# Patient Record
Sex: Male | Born: 1985 | Race: White | Hispanic: No | Marital: Single | Smoking: Former smoker
Health system: Southern US, Community
[De-identification: ages and names within clinical notes are randomized; demographics above are authoritative.]

## PROBLEM LIST (undated history)

## (undated) DIAGNOSIS — R7611 Nonspecific reaction to tuberculin skin test without active tuberculosis: Secondary | ICD-10-CM

## (undated) HISTORY — DX: Nonspecific reaction to tuberculin skin test without active tuberculosis: R76.11

---

## 2005-06-12 ENCOUNTER — Ambulatory Visit (HOSPITAL_COMMUNITY): Admission: RE | Admit: 2005-06-12 | Discharge: 2005-06-12 | Payer: Self-pay | Admitting: Pediatrics

## 2007-10-29 ENCOUNTER — Encounter: Admission: RE | Admit: 2007-10-29 | Discharge: 2008-01-27 | Payer: Self-pay | Admitting: Orthopedic Surgery

## 2011-09-26 ENCOUNTER — Encounter: Payer: Self-pay | Admitting: Internal Medicine

## 2011-09-26 ENCOUNTER — Ambulatory Visit (INDEPENDENT_AMBULATORY_CARE_PROVIDER_SITE_OTHER): Payer: BC Managed Care – PPO | Admitting: Internal Medicine

## 2011-09-26 VITALS — BP 128/80 | HR 70 | Temp 98.2°F | Resp 18 | Ht 72.5 in | Wt 188.0 lb

## 2011-09-26 DIAGNOSIS — L309 Dermatitis, unspecified: Secondary | ICD-10-CM

## 2011-09-26 DIAGNOSIS — F909 Attention-deficit hyperactivity disorder, unspecified type: Secondary | ICD-10-CM | POA: Insufficient documentation

## 2011-09-26 DIAGNOSIS — R7611 Nonspecific reaction to tuberculin skin test without active tuberculosis: Secondary | ICD-10-CM

## 2011-09-26 DIAGNOSIS — L259 Unspecified contact dermatitis, unspecified cause: Secondary | ICD-10-CM

## 2011-09-26 MED ORDER — AMPHETAMINE-DEXTROAMPHETAMINE 10 MG PO TABS: 10.0000 mg | ORAL_TABLET | Freq: Two times a day (BID) | ORAL | Status: DC

## 2011-09-26 MED ORDER — PREDNISONE 10 MG PO TABS: ORAL_TABLET | ORAL | Status: DC

## 2011-09-26 MED ORDER — METHYLPREDNISOLONE ACETATE 80 MG/ML IJ SUSP
80.0000 mg | Freq: Once | INTRAMUSCULAR | Status: AC
  Administered 2011-09-26: 80 mg via INTRAMUSCULAR
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
## 2011-09-26 NOTE — Patient Instructions (Addendum)
Pepcid 20 mg twice daily Alavert/Allegra  one daily  Prednisone Dosepak as directed  Please call in one or 2 weeks for dermatology referral if unimproved  Smoking tobacco is very bad for your health. You should stop smoking immediately.

## 2011-11-28 ENCOUNTER — Encounter (HOSPITAL_COMMUNITY): Payer: Self-pay | Admitting: *Deleted

## 2011-11-28 ENCOUNTER — Other Ambulatory Visit: Payer: Self-pay

## 2011-11-28 ENCOUNTER — Telehealth: Payer: Self-pay

## 2011-11-28 ENCOUNTER — Emergency Department (HOSPITAL_COMMUNITY)
Admission: EM | Admit: 2011-11-28 | Discharge: 2011-11-29 | Disposition: A | Discharge status: Home / Self Care | Payer: BC Managed Care – PPO | Attending: Emergency Medicine | Admitting: Emergency Medicine

## 2011-11-28 DIAGNOSIS — R10816 Epigastric abdominal tenderness: Secondary | ICD-10-CM | POA: Insufficient documentation

## 2011-11-28 DIAGNOSIS — R55 Syncope and collapse: Secondary | ICD-10-CM | POA: Insufficient documentation

## 2011-11-28 DIAGNOSIS — R079 Chest pain, unspecified: Secondary | ICD-10-CM | POA: Insufficient documentation

## 2011-11-28 DIAGNOSIS — R42 Dizziness and giddiness: Secondary | ICD-10-CM | POA: Insufficient documentation

## 2011-11-28 DIAGNOSIS — R198 Other specified symptoms and signs involving the digestive system and abdomen: Secondary | ICD-10-CM

## 2011-11-28 LAB — DIFFERENTIAL
Basophils Absolute: 0 10*3/uL (ref 0.0–0.1)
Eosinophils Absolute: 0.1 10*3/uL (ref 0.0–0.7)
Lymphocytes Relative: 27 % (ref 12–46)
Lymphs Abs: 2 10*3/uL (ref 0.7–4.0)
Monocytes Relative: 7 % (ref 3–12)
Neutro Abs: 4.7 10*3/uL (ref 1.7–7.7)
Neutrophils Relative %: 64 % (ref 43–77)

## 2011-11-28 LAB — COMPREHENSIVE METABOLIC PANEL
ALT: 7 U/L (ref 0–53)
Albumin: 4.6 g/dL (ref 3.5–5.2)
Alkaline Phosphatase: 67 U/L (ref 39–117)
BUN: 14 mg/dL (ref 6–23)
Calcium: 10.6 mg/dL — ABNORMAL HIGH (ref 8.4–10.5)
Creatinine, Ser: 0.96 mg/dL (ref 0.50–1.35)
GFR calc non Af Amer: >90 mL/min (ref >90)
Glucose, Bld: 116 mg/dL — ABNORMAL HIGH (ref 70–99)
Potassium: 4.4 mEq/L (ref 3.5–5.1)
Sodium: 137 mEq/L (ref 135–145)
Total Bilirubin: 0.4 mg/dL (ref 0.3–1.2)
Total Protein: 7.7 g/dL (ref 6.0–8.3)

## 2011-11-28 LAB — CBC
RBC: 5.14 MIL/uL (ref 4.22–5.81)
RDW: 12.4 % (ref 11.5–15.5)
WBC: 7.3 10*3/uL (ref 4.0–10.5)

## 2011-11-28 LAB — POCT I-STAT TROPONIN I

## 2011-11-28 MED ORDER — GI COCKTAIL ~~LOC~~
30.0000 mL | Freq: Once | ORAL | Status: AC
  Administered 2011-11-28: 30 mL via ORAL
  Filled 2011-11-28: qty 30

## 2011-11-28 NOTE — Telephone Encounter (Signed)
Spoke with pt and he states for the past week he has been having tightness in his chest around his heart.  Per pt it lasts anywhere from 30 minutes to 1.5 hours and tends to go away and come back.  When pt has these episodes he experiences dizziness as well. Offered pt appt today but he states he has finals until 6:30. Pt has an appt for 11/29/11.

## 2011-11-28 NOTE — ED Provider Notes (Addendum)
History     CSN: [id number] Arrival date & time: 11/28/2011  5:33 PM   First MD Initiated Contact with Patient 11/28/11 2210      Chief Complaint  Patient presents with  . Chest Pain    pt states "my heart feels like it is struggling to breathe." x's 5-6 days. pt states pain has been getting worse.     (Consider location/radiation/quality/duration/timing/severity/associated sxs/prior treatment) HPI Comments: Patient reports 6 days of epigastric pain without radiation.  Pain is constant, associated with nausea, no vomiting.  No shortness of breath, no cough, no wheezing, has never had pain like this before states this morning.  The pain became worse at 6:30 in the morning.  He called his primary care physician.  He will see tomorrow but was told to come to the emergency room if he felt uncomfortable waiting until tomorrow.  Patient states he proceeded about his day taking his finals and then decided to come to the emergency room for evaluation.  He has not tried any over-the-counter medications, has no previous history of this discomfort has no family history of early cardiac disease.   Patient is a 25 y.o. male presenting with chest pain. The history is provided by the patient.  Chest Pain The chest pain began 5 - 7 days ago. Chest pain occurs intermittently. The chest pain is worsening. At its most intense, the pain is at 3/10. The pain is currently at 3/10. The severity of the pain is mild. The quality of the pain is described as pressure-like. The pain does not radiate. Primary symptoms include abdominal pain, nausea and dizziness. Pertinent negatives for primary symptoms include no fever, no syncope, no shortness of breath, no cough, no palpitations and no vomiting.  Dizziness also occurs with nausea and weakness. Dizziness does not occur with vomiting.   Associated symptoms include near-syncope and weakness. He tried nothing for the symptoms.     Past Medical History  Diagnosis Date    . Positive PPD     cxr - normal , no tx required    History reviewed. No pertinent past surgical history.  Family History  Problem Relation Age of Onset  . Hypertension Mother   . Diabetes Paternal Grandmother     History  Substance Use Topics  . Smoking status: Former Smoker -- 0.3 packs/day    Types: Cigarettes  . Smokeless tobacco: Never Used  . Alcohol Use: Yes      Review of Systems  Constitutional: Negative for fever, chills and activity change.  HENT: Negative for sore throat, rhinorrhea and trouble swallowing.   Respiratory: Negative for cough and shortness of breath.   Cardiovascular: Positive for chest pain and near-syncope. Negative for palpitations and syncope.  Gastrointestinal: Positive for nausea and abdominal pain. Negative for vomiting.  Genitourinary: Negative for dysuria and frequency.  Musculoskeletal: Negative for myalgias and back pain.  Neurological: Positive for dizziness and weakness.  Hematological: Negative.   Psychiatric/Behavioral: Negative.     Allergies  Review of patient's allergies indicates no known allergies.  Home Medications   Current Outpatient Rx  Name Route Sig Dispense Refill  . AMPHETAMINE-DEXTROAMPHETAMINE 10 MG PO TABS Oral Take 1 tablet (10 mg total) by mouth 2 (two) times daily. 60 tablet 0  . HYDROCODONE-ACETAMINOPHEN 7.5-750 MG PO TABS Oral Take 1 tablet by mouth every 8 (eight) hours as needed.        BP 129/86  Pulse 71  Temp(Src) 98.7 F (37.1 C) (Oral)  Resp 17  Wt 185 lb (83.915 kg)  SpO2 99%  Physical Exam  Constitutional: He is oriented to person, place, and time. He appears well-developed and well-nourished.  HENT:  Head: Normocephalic.  Eyes: Pupils are equal, round, and reactive to light.  Neck: Normal range of motion.  Cardiovascular: Normal rate and regular rhythm.   Pulmonary/Chest: Effort normal and breath sounds normal. No respiratory distress. He has no wheezes. He has no rales. He exhibits  no tenderness.  Abdominal: Soft. Bowel sounds are normal. He exhibits no distension and no mass. There is tenderness in the epigastric area. There is no rebound.    Musculoskeletal: Normal range of motion.  Neurological: He is oriented to person, place, and time.  Skin: Skin is warm and dry. No rash noted.  Psychiatric: He has a normal mood and affect.    ED Course  Procedures (including critical care time)  Labs Reviewed  COMPREHENSIVE METABOLIC PANEL - Abnormal; Notable for the following:    Glucose, Bld 116 (*)    Calcium 10.6 (*)    All other components within normal limits  CBC  DIFFERENTIAL  LIPASE, BLOOD  POCT I-STAT TROPONIN I  I-STAT TROPONIN I   No results found.   1. Epigastric heaviness       MDM  Will review labs, including CBC C. Met lipase were administered.  GI cocktail, as is most likely gastritis, GERD, stress induced        Arman Filter, NP 11/28/11 2229  Arman Filter, NP 11/29/11 0403

## 2011-11-28 NOTE — ED Notes (Signed)
ED PA at bedside

## 2011-11-29 ENCOUNTER — Ambulatory Visit: Payer: BC Managed Care – PPO | Admitting: Internal Medicine

## 2011-11-29 DIAGNOSIS — Z0289 Encounter for other administrative examinations: Secondary | ICD-10-CM

## 2011-11-29 NOTE — ED Notes (Signed)
Friend at bs

## 2011-11-29 NOTE — ED Provider Notes (Signed)
Medical screening examination/treatment/procedure(s) were performed by non-physician practitioner and as supervising physician I was immediately available for consultation/collaboration.  Nicholes Stairs, MD 11/29/11 [phone]

## 2011-11-29 NOTE — ED Provider Notes (Signed)
Medical screening examination/treatment/procedure(s) were performed by non-physician practitioner and as supervising physician I was immediately available for consultation/collaboration.   Lyanne Co, MD 11/29/11 [phone]

## 2012-05-17 ENCOUNTER — Encounter: Payer: Self-pay | Admitting: Internal Medicine

## 2012-05-17 ENCOUNTER — Ambulatory Visit (INDEPENDENT_AMBULATORY_CARE_PROVIDER_SITE_OTHER): Payer: BC Managed Care – PPO | Admitting: Internal Medicine

## 2012-05-17 VITALS — BP 112/90 | HR 72 | Temp 97.9°F | Resp 16 | Ht 72.0 in | Wt 198.0 lb

## 2012-05-17 DIAGNOSIS — Z789 Other specified health status: Secondary | ICD-10-CM

## 2012-05-17 LAB — LIPID PANEL
LDL Cholesterol: 82 mg/dL (ref 0–99)
Total CHOL/HDL Ratio: 3
VLDL: 31.2 mg/dL (ref 0.0–40.0)

## 2012-05-17 MED ORDER — FLUCONAZOLE 150 MG PO TABS: ORAL_TABLET | ORAL | Status: DC

## 2012-05-17 NOTE — Progress Notes (Signed)
Subjective:    Patient ID: Alexander Perez, male    DOB: [birth year], 26 y.o.   MRN: [id number]  HPI  26 year old patient who is seen today for a health assessment. 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 He enjoys excellent health. Does have a history of ADHD which he took during school but no longer requires. He has a remote history of positive PPD. He takes no chronic medications. He does have a history of occasional low back pain that has not been an issue in some time. He takes no chronic medications.  Past Medical History  Diagnosis Date  . Positive PPD     cxr - normal , no tx required    History   Social History  . Marital Status: Single    Spouse Name: N/A    Number of Children: N/A  . Years of Education: N/A   Occupational History  . Not on file.   Social History Main Topics  . Smoking status: Current Everyday Smoker -- 0.3 packs/day    Types: Cigarettes  . Smokeless tobacco: Never Used  . Alcohol Use: Yes  . Drug Use: No  . Sexually Active: Not on file   Other Topics Concern  . Not on file   Social History Narrative  . No narrative on file    No past surgical history on file.  Family History  Problem Relation Age of Onset  . Hypertension Mother   . Diabetes Paternal Grandmother     No Known Allergies  Current Outpatient Prescriptions on File Prior to Visit  Medication Sig Dispense Refill  . amphetamine-dextroamphetamine (ADDERALL) 10 MG tablet Take 1 tablet (10 mg total) by mouth 2 (two) times daily.  60 tablet  0  . HYDROcodone-acetaminophen (VICODIN ES) 7.5-750 MG per tablet Take 1 tablet by mouth every 8 (eight) hours as needed.          BP 112/90  Pulse 72  Temp(Src) 97.9 F (36.6 C) (Oral)  Resp 16  Ht 6' (1.829 m)  Wt 198 lb (89.812 kg)  BMI 26.85 kg/m2  SpO2 99%       Review of Systems  Constitutional: Negative for fever, chills, appetite change and fatigue.  HENT: Negative for hearing  loss, ear pain, congestion, sore throat, trouble swallowing, neck stiffness, dental problem, voice change and tinnitus.   Eyes: Negative for pain, discharge and visual disturbance.  Respiratory: Negative for cough, chest tightness, wheezing and stridor.   Cardiovascular: Negative for chest pain, palpitations and leg swelling.  Gastrointestinal: Negative for nausea, vomiting, abdominal pain, diarrhea, constipation, blood in stool and abdominal distention.  Genitourinary: Negative for urgency, hematuria, flank pain, discharge, difficulty urinating and genital sores.  Musculoskeletal: Negative for myalgias, back pain, joint swelling, arthralgias and gait problem.  Skin: Negative for rash.  Neurological: Negative for dizziness, syncope, speech difficulty, weakness, numbness and headaches.  Hematological: Negative for adenopathy. Does not bruise/bleed easily.  Psychiatric/Behavioral: Negative for behavioral problems and dysphoric mood. The patient is not nervous/anxious.        Objective:   Physical Exam  Constitutional: He appears well-developed and well-nourished.  HENT:  Head: Normocephalic and atraumatic.  Right Ear: External ear normal.  Left Ear: External ear normal.  Nose: Nose normal.  Mouth/Throat: Oropharynx is clear and moist.  Eyes: Conjunctivae and EOM are normal. Pupils are equal, round, and reactive to light. No scleral icterus.  Neck: Normal range of motion. Neck supple. No JVD present. No  thyromegaly present.  Cardiovascular: Regular rhythm, normal heart sounds and intact distal pulses.  Exam reveals no gallop and no friction rub.   No murmur heard. Pulmonary/Chest: Effort normal and breath sounds normal. He exhibits no tenderness.  Abdominal: Soft. Bowel sounds are normal. He exhibits no distension and no mass. There is no tenderness.  Musculoskeletal: Normal range of motion. He exhibits no edema and no tenderness.  Lymphadenopathy:    He has no cervical adenopathy.    Neurological: He is alert. He has normal reflexes. No cranial nerve deficit. Coordination normal.  Skin: Skin is warm and dry. No rash noted.  Psychiatric: He has a normal mood and affect. His behavior is normal.          Assessment & Plan:   Health assessment  Patient is a Health. Active lifestyle  Encouraged;  preemployment forms completed

## 2012-05-17 NOTE — Patient Instructions (Signed)
Call or return to clinic prn if these symptoms worsen or fail to improve as anticipated.

## 2012-05-18 NOTE — Progress Notes (Signed)
Quick Note:  Spoke with pt - informed of lab results - normal ______ 

## 2012-07-20 ENCOUNTER — Ambulatory Visit (INDEPENDENT_AMBULATORY_CARE_PROVIDER_SITE_OTHER): Payer: BC Managed Care – PPO | Admitting: Internal Medicine

## 2012-07-20 ENCOUNTER — Encounter: Payer: Self-pay | Admitting: Internal Medicine

## 2012-07-20 VITALS — BP 120/70 | HR 82 | Temp 98.4°F | Resp 18 | Ht 72.5 in | Wt 196.0 lb

## 2012-07-20 DIAGNOSIS — F909 Attention-deficit hyperactivity disorder, unspecified type: Secondary | ICD-10-CM
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
## 2012-07-20 NOTE — Patient Instructions (Signed)
Call or return to clinic prn if these symptoms worsen or fail to improve as anticipated.

## 2014-01-02 ENCOUNTER — Ambulatory Visit (INDEPENDENT_AMBULATORY_CARE_PROVIDER_SITE_OTHER): Payer: BC Managed Care – PPO | Admitting: Internal Medicine

## 2014-01-02 ENCOUNTER — Encounter: Payer: Self-pay | Admitting: Internal Medicine

## 2014-01-02 VITALS — BP 130/90 | HR 87 | Temp 98.3°F | Resp 20 | Ht 72.5 in | Wt 205.0 lb

## 2014-01-02 DIAGNOSIS — R5381 Other malaise: Secondary | ICD-10-CM

## 2014-01-02 DIAGNOSIS — G4700 Insomnia, unspecified: Secondary | ICD-10-CM

## 2014-01-02 DIAGNOSIS — R42 Dizziness and giddiness: Secondary | ICD-10-CM

## 2014-01-02 DIAGNOSIS — R5383 Other fatigue: Principal | ICD-10-CM

## 2014-01-02 LAB — SEDIMENTATION RATE: Sed Rate: 3 mm/hr (ref 0–22)

## 2014-01-02 LAB — CBC WITH DIFFERENTIAL/PLATELET
BASOS PCT: 0.3 % (ref 0.0–3.0)
Basophils Absolute: 0 10*3/uL (ref 0.0–0.1)
Eosinophils Absolute: 0.1 10*3/uL (ref 0.0–0.7)
Eosinophils Relative: 1.5 % (ref 0.0–5.0)
HCT: 47.6 % (ref 39.0–52.0)
Hemoglobin: 16.1 g/dL (ref 13.0–17.0)
LYMPHS ABS: 2.4 10*3/uL (ref 0.7–4.0)
LYMPHS PCT: 29.8 % (ref 12.0–46.0)
MCHC: 33.9 g/dL (ref 30.0–36.0)
MCV: 88.3 fl (ref 78.0–100.0)
MONOS PCT: 8.3 % (ref 3.0–12.0)
Monocytes Absolute: 0.7 10*3/uL (ref 0.1–1.0)
Neutro Abs: 4.9 10*3/uL (ref 1.4–7.7)
Neutrophils Relative %: 60.1 % (ref 43.0–77.0)
PLATELETS: 196 10*3/uL (ref 150.0–400.0)
RBC: 5.39 Mil/uL (ref 4.22–5.81)
RDW: 12.7 % (ref 11.5–14.6)
WBC: 8.1 10*3/uL (ref 4.5–10.5)

## 2014-01-02 LAB — COMPREHENSIVE METABOLIC PANEL
ALK PHOS: 57 U/L (ref 39–117)
ALT: 15 U/L (ref 0–53)
AST: 24 U/L (ref 0–37)
Albumin: 4.6 g/dL (ref 3.5–5.2)
BILIRUBIN TOTAL: 0.9 mg/dL (ref 0.3–1.2)
BUN: 17 mg/dL (ref 6–23)
CALCIUM: 10.3 mg/dL (ref 8.4–10.5)
CO2: 29 mEq/L (ref 19–32)
Chloride: 102 mEq/L (ref 96–112)
Creatinine, Ser: 1.3 mg/dL (ref 0.4–1.5)
GFR: 72.16 mL/min (ref >60.00)
GLUCOSE: 88 mg/dL (ref 70–99)
Potassium: 4.8 mEq/L (ref 3.5–5.1)
SODIUM: 139 meq/L (ref 135–145)
TOTAL PROTEIN: 7.4 g/dL (ref 6.0–8.3)

## 2014-01-02 LAB — TSH: TSH: 1.57 u[IU]/mL (ref 0.35–5.50)

## 2014-01-02 LAB — CK: Total CK: 72 U/L (ref 7–232)

## 2014-01-02 NOTE — Progress Notes (Signed)
Subjective:    Patient ID: Alexander Perez, male    DOB: 30-Jun-1986, 28 y.o.   MRN: [id number]  HPI  28 year old patient who presents with a 2-3 month history of dizziness and fatigue. He says symptoms are associated with exertion. He states he becomes weak and lightheaded when he walks up a flight of stairs. He does get to his health club about once weekly and states he does well with aerobic activities such as running but has noted poor exercise capacity and lightheadedness with weight training. He does complain of chronic insomnia and stress but these are long-standing and unchanged. He takes no chronic medications He does have a history of ADHD but has been off Adderall for some time.  Family history positive for thyroid disease  Past Medical History  Diagnosis Date  . Positive PPD     cxr - normal , no tx required    History   Social History  . Marital Status: Single    Spouse Name: N/A    Number of Children: N/A  . Years of Education: N/A   Occupational History  . Not on file.   Social History Main Topics  . Smoking status: Current Every Day Smoker -- 0.30 packs/day    Types: Cigarettes  . Smokeless tobacco: Never Used     Comment: 1 cigarette a day  . Alcohol Use: Yes  . Drug Use: No  . Sexual Activity: Not on file   Other Topics Concern  . Not on file   Social History Narrative  . No narrative on file    History reviewed. No pertinent past surgical history.  Family History  Problem Relation Age of Onset  . Hypertension Mother   . Diabetes Paternal Grandmother     No Known Allergies  No current outpatient prescriptions on file prior to visit.   No current facility-administered medications on file prior to visit.    BP 130/90  Pulse 87  Temp(Src) 98.3 F (36.8 C) (Oral)  Resp 20  Ht 6' 0.5" (1.842 m)  Wt 205 lb (92.987 kg)  BMI 27.41 kg/m2  SpO2 98%       Review of Systems  Constitutional: Positive for activity change, fatigue and  unexpected weight change. Negative for fever, chills and appetite change.  HENT: Negative for congestion, dental problem, ear pain, hearing loss, sore throat, tinnitus, trouble swallowing and voice change.   Eyes: Negative for pain, discharge and visual disturbance.  Respiratory: Negative for cough, chest tightness, wheezing and stridor.   Cardiovascular: Negative for chest pain, palpitations and leg swelling.  Gastrointestinal: Negative for nausea, vomiting, abdominal pain, diarrhea, constipation, blood in stool and abdominal distention.  Genitourinary: Negative for urgency, hematuria, flank pain, discharge, difficulty urinating and genital sores.  Musculoskeletal: Negative for arthralgias, back pain, gait problem, joint swelling, myalgias and neck stiffness.  Skin: Negative for rash.  Neurological: Positive for weakness and light-headedness. Negative for dizziness, syncope, speech difficulty, numbness and headaches.  Hematological: Negative for adenopathy. Does not bruise/bleed easily.  Psychiatric/Behavioral: Negative for behavioral problems and dysphoric mood. The patient is not nervous/anxious.        Objective:   Physical Exam  Constitutional: He is oriented to person, place, and time. He appears well-developed.  HENT:  Head: Normocephalic.  Right Ear: External ear normal.  Left Ear: External ear normal.  Eyes: Conjunctivae and EOM are normal.  Neck: Normal range of motion.  Cardiovascular: Normal rate and normal heart sounds.   Pulmonary/Chest: Breath sounds normal.  Abdominal: Bowel sounds are normal.  Musculoskeletal: Normal range of motion. He exhibits no edema and no tenderness.  Neurological: He is alert and oriented to person, place, and time. He has normal reflexes.  Psychiatric: He has a normal mood and affect. His behavior is normal.          Assessment & Plan:   Fatigue and dizziness. Insomnia Stress Normal clinical exam A DD  Sleep hygiene discussed.  We'll check some screening lab

## 2014-01-02 NOTE — Progress Notes (Signed)
Pre-visit discussion using our clinic review tool. No additional management support is needed unless otherwise documented below in the visit note.  

## 2014-01-02 NOTE — Patient Instructions (Signed)

## 2014-01-19 ENCOUNTER — Telehealth: Payer: Self-pay | Admitting: Internal Medicine

## 2014-01-19 NOTE — Telephone Encounter (Signed)
Relevant patient education assigned to patient using Emmi. ° °

## 2014-01-30 ENCOUNTER — Encounter: Payer: Self-pay | Admitting: Internal Medicine

## 2014-01-30 ENCOUNTER — Ambulatory Visit (INDEPENDENT_AMBULATORY_CARE_PROVIDER_SITE_OTHER): Payer: BC Managed Care – PPO | Admitting: Internal Medicine

## 2014-01-30 VITALS — BP 150/100 | HR 76 | Temp 98.4°F | Resp 20 | Ht 72.5 in | Wt 206.0 lb

## 2014-01-30 DIAGNOSIS — G4700 Insomnia, unspecified: Secondary | ICD-10-CM

## 2014-01-30 NOTE — Progress Notes (Signed)
Subjective:    Patient ID: Alexander Perez, male    DOB: 05-Jul-1986, 28 y.o.   MRN: [id number]  HPI  28 year old patient who is in today for followup. He is doing somewhat better with his sleep hygiene issues and is now sleep on a more regular basis and avoiding naps. He continues to have a difficult time falling asleep it to excessive worry but has improved. He has not gone to his health club on a regular basis due to time constraints in general feels he is doing well. He is reassured by his normal laboratory screen.  Past Medical History  Diagnosis Date  . Positive PPD     cxr - normal , no tx required    History   Social History  . Marital Status: Single    Spouse Name: N/A    Number of Children: N/A  . Years of Education: N/A   Occupational History  . Not on file.   Social History Main Topics  . Smoking status: Current Every Day Smoker -- 0.30 packs/day    Types: Cigarettes  . Smokeless tobacco: Never Used     Comment: 1 cigarette a day  . Alcohol Use: Yes  . Drug Use: No  . Sexual Activity: Not on file   Other Topics Concern  . Not on file   Social History Narrative  . No narrative on file    History reviewed. No pertinent past surgical history.  Family History  Problem Relation Age of Onset  . Hypertension Mother   . Diabetes Paternal Grandmother     No Known Allergies  Current Outpatient Prescriptions on File Prior to Visit  Medication Sig Dispense Refill  . Multiple Vitamin (MULTIVITAMIN) tablet Take 1 tablet by mouth daily.      . NON FORMULARY Take 1 capsule by mouth as needed. Alpha Brain       No current facility-administered medications on file prior to visit.    BP 150/100  Pulse 76  Temp(Src) 98.4 F (36.9 C) (Oral)  Resp 20  Ht 6' 0.5" (1.842 m)  Wt 206 lb (93.441 kg)  BMI 27.54 kg/m2  SpO2 98%       Review of Systems  Constitutional: Negative for fever, chills, appetite change and fatigue.  HENT: Negative for congestion, dental  problem, ear pain, hearing loss, sore throat, tinnitus, trouble swallowing and voice change.   Eyes: Negative for pain, discharge and visual disturbance.  Respiratory: Negative for cough, chest tightness, wheezing and stridor.   Cardiovascular: Negative for chest pain, palpitations and leg swelling.  Gastrointestinal: Negative for nausea, vomiting, abdominal pain, diarrhea, constipation, blood in stool and abdominal distention.  Genitourinary: Negative for urgency, hematuria, flank pain, discharge, difficulty urinating and genital sores.  Musculoskeletal: Negative for arthralgias, back pain, gait problem, joint swelling, myalgias and neck stiffness.  Skin: Negative for rash.  Neurological: Negative for dizziness, syncope, speech difficulty, weakness, numbness and headaches.  Hematological: Negative for adenopathy. Does not bruise/bleed easily.  Psychiatric/Behavioral: Positive for sleep disturbance. Negative for behavioral problems and dysphoric mood. The patient is not nervous/anxious.        Objective:   Physical Exam  Constitutional: He appears well-developed and well-nourished. No distress.  Psychiatric: He has a normal mood and affect. His behavior is normal. Judgment and thought content normal.          Assessment & Plan:   Sleep disturbance. This appears to be a fairly mild. Options discussed including behavioral health referral and possible SSRI  therapy. He is quite reassured that his laboratory screen and exam are normal. He will work on stress management and return when necessary

## 2014-01-30 NOTE — Patient Instructions (Signed)
It is important that you exercise regularly, at least 20 minutes 3 to 4 times per week.  If you develop chest pain or shortness of breath seek  medical attention.  Call or return to clinic prn if these symptoms worsen or fail to improve as anticipated.  

## 2014-01-30 NOTE — Progress Notes (Signed)
Pre-visit discussion using our clinic review tool. No additional management support is needed unless otherwise documented below in the visit note.  

## 2014-01-31 ENCOUNTER — Telehealth: Payer: Self-pay | Admitting: Internal Medicine

## 2014-01-31 NOTE — Telephone Encounter (Signed)
Relevant patient education assigned to patient using Emmi. ° °

## 2015-05-05 ENCOUNTER — Ambulatory Visit (INDEPENDENT_AMBULATORY_CARE_PROVIDER_SITE_OTHER): Payer: Managed Care, Other (non HMO) | Admitting: Family Medicine

## 2015-05-05 ENCOUNTER — Encounter: Payer: Self-pay | Admitting: Family Medicine

## 2015-05-05 VITALS — BP 136/87 | HR 75 | Temp 98.6°F | Ht 72.5 in | Wt 200.0 lb

## 2015-05-05 DIAGNOSIS — R0789 Other chest pain: Secondary | ICD-10-CM | POA: Diagnosis not present

## 2015-05-05 LAB — HEPATIC FUNCTION PANEL
ALBUMIN: 4.8 g/dL (ref 3.5–5.2)
ALT: 12 U/L (ref 0–53)
AST: 23 U/L (ref 0–37)
Alkaline Phosphatase: 63 U/L (ref 39–117)
Bilirubin, Direct: 0.2 mg/dL (ref 0.0–0.3)
TOTAL PROTEIN: 7.3 g/dL (ref 6.0–8.3)
Total Bilirubin: 1 mg/dL (ref 0.2–1.2)

## 2015-05-05 LAB — POCT URINALYSIS DIPSTICK
BILIRUBIN UA: NEGATIVE
GLUCOSE UA: NEGATIVE
KETONES UA: NEGATIVE
Leukocytes, UA: NEGATIVE
Nitrite, UA: NEGATIVE
Protein, UA: NEGATIVE
RBC UA: NEGATIVE
SPEC GRAV UA: 1.01
UROBILINOGEN UA: 0.2
pH, UA: 6.5

## 2015-05-05 LAB — CBC WITH DIFFERENTIAL/PLATELET
BASOS PCT: 0.4 % (ref 0.0–3.0)
Basophils Absolute: 0 10*3/uL (ref 0.0–0.1)
EOS PCT: 1.8 % (ref 0.0–5.0)
Eosinophils Absolute: 0.1 10*3/uL (ref 0.0–0.7)
HCT: 48.7 % (ref 39.0–52.0)
Hemoglobin: 16.6 g/dL (ref 13.0–17.0)
Lymphocytes Relative: 27.7 % (ref 12.0–46.0)
Lymphs Abs: 2.1 10*3/uL (ref 0.7–4.0)
MCHC: 34.2 g/dL (ref 30.0–36.0)
MCV: 87.7 fl (ref 78.0–100.0)
MONO ABS: 0.6 10*3/uL (ref 0.1–1.0)
Monocytes Relative: 7.5 % (ref 3.0–12.0)
NEUTROS ABS: 4.8 10*3/uL (ref 1.4–7.7)
NEUTROS PCT: 62.6 % (ref 43.0–77.0)
PLATELETS: 198 10*3/uL (ref 150.0–400.0)
RBC: 5.55 Mil/uL (ref 4.22–5.81)
RDW: 12.6 % (ref 11.5–15.5)
WBC: 7.6 10*3/uL (ref 4.0–10.5)

## 2015-05-05 LAB — BASIC METABOLIC PANEL
BUN: 15 mg/dL (ref 6–23)
CHLORIDE: 100 meq/L (ref 96–112)
CO2: 29 meq/L (ref 19–32)
Calcium: 10.1 mg/dL (ref 8.4–10.5)
Creatinine, Ser: 1.11 mg/dL (ref 0.40–1.50)
GFR: 83.48 mL/min (ref >60.00)
GLUCOSE: 103 mg/dL — AB (ref 70–99)
POTASSIUM: 3.7 meq/L (ref 3.5–5.1)
SODIUM: 135 meq/L (ref 135–145)

## 2015-05-05 LAB — AMYLASE: AMYLASE: 36 U/L (ref 27–131)

## 2015-05-05 LAB — LIPASE: Lipase: 19 U/L (ref 11.0–59.0)

## 2015-05-05 MED ORDER — ESOMEPRAZOLE MAGNESIUM 20 MG PO CPDR: 20.0000 mg | DELAYED_RELEASE_CAPSULE | Freq: Every day | ORAL | Status: DC

## 2015-05-05 NOTE — Progress Notes (Signed)
Pre visit review using our clinic review tool, if applicable. No additional management support is needed unless otherwise documented below in the visit note. 

## 2015-05-05 NOTE — Progress Notes (Signed)
   Subjective:    Patient ID: Alexander Perez, male    DOB: 1986/06/15, 29 y.o.   MRN: [id number]  HPI Here for a constellation of symptoms that started 2 weeks ago. These began with intermittent nausea and he has vomited twice. He has mild lower abdominal cramps at times and ha shad some light diarrhea. Also he has had some intermittent tightness in the chest and mild right sided chest pain. No SOB. No cough or fever. He admits to being under a lot of stress and he wonders if these sx could be related to that.    Review of Systems  Constitutional: Positive for appetite change. Negative for fever, chills, diaphoresis and unexpected weight change.  Respiratory: Positive for chest tightness. Negative for cough and shortness of breath.   Cardiovascular: Positive for chest pain. Negative for palpitations and leg swelling.  Gastrointestinal: Positive for nausea, vomiting, abdominal pain and diarrhea. Negative for constipation, blood in stool, abdominal distention, anal bleeding and rectal pain.  Genitourinary: Negative.   Psychiatric/Behavioral: Negative for hallucinations, behavioral problems, confusion, dysphoric mood, decreased concentration and agitation. The patient is nervous/anxious.        Objective:   Physical Exam  Constitutional: He appears well-developed and well-nourished. No distress.  Neck: Neck supple. No thyromegaly present.  Cardiovascular: Normal rate, regular rhythm, normal heart sounds and intact distal pulses.   EKG normal   Pulmonary/Chest: Effort normal and breath sounds normal. No respiratory distress. He has no wheezes. He has no rales. He exhibits no tenderness.  Abdominal: Soft. Bowel sounds are normal. He exhibits no distension and no mass. There is no tenderness. There is no rebound and no guarding.  Lymphadenopathy:    He has no cervical adenopathy.  Psychiatric: His behavior is normal. Thought content normal.  anxious          Assessment & Plan:    Intermittent nausea with vomiting, chest tightness, abdominal cramps, and diarrhea. These could be the result of a GI virus, of GERD, or of anxiety. I reassured him this was not of a cardiac etiology. To cover for possible GERD I suggested he try Nexium OTC daily. Get labs today. I mentioned the possibility of getting psychotherapy if this turns out to be the result of anxiety. He agrees.

## 2015-06-04 ENCOUNTER — Ambulatory Visit (INDEPENDENT_AMBULATORY_CARE_PROVIDER_SITE_OTHER): Payer: Managed Care, Other (non HMO) | Admitting: Internal Medicine

## 2015-06-04 ENCOUNTER — Encounter: Payer: Self-pay | Admitting: Internal Medicine

## 2015-06-04 VITALS — BP 146/90 | HR 85 | Temp 97.7°F | Resp 20 | Ht 72.5 in | Wt 200.0 lb

## 2015-06-04 DIAGNOSIS — B360 Pityriasis versicolor: Secondary | ICD-10-CM

## 2015-06-04 MED ORDER — SELENIUM SULFIDE 2.5 % EX LOTN: 1.0000 "application " | TOPICAL_LOTION | Freq: Every day | CUTANEOUS | Status: DC | PRN

## 2015-06-04 MED ORDER — FLUCONAZOLE 150 MG PO TABS: ORAL_TABLET | ORAL | Status: DC

## 2015-06-04 NOTE — Patient Instructions (Addendum)
Selenium sulfide 2.5 percent apply daily for 10 minutes for the next 7 days Fluconazole 2 tablets today and repeat in one week  Dermatology follow-upTinea Versicolor Tinea versicolor is a common yeast infection of the skin. This condition becomes known when the yeast on our skin starts to overgrow (yeast is a normal inhabitant on our skin). This condition is noticed as white or light brown patches on brown skin, and is more evident in the summer on tanned skin. These areas are slightly scaly if scratched. The light patches from the yeast become evident when the yeast creates "holes in your suntan". This is most often noticed in the summer. The patches are usually located on the chest, back, pubis, neck and body folds. However, it may occur on any area of body. Mild itching and inflammation (redness or soreness) may be present. DIAGNOSIS  The diagnosisof this is made clinically (by looking). Cultures from samples are usually not needed. Examination under the microscope may help. However, yeast is normally found on skin. The diagnosis still remains clinical. Examination under Wood's Ultraviolet Light can determine the extent of the infection. TREATMENT  This common infection is usually only of cosmetic (only a concern to your appearance). It is easily treated with dandruff shampoo used during showers or bathing. Vigorous scrubbing will eliminate the yeast over several days time. The light areas in your skin may remain for weeks or months after the infection is cured unless your skin is exposed to sunlight. The lighter or darker spots caused by the fungus that remain after complete treatment are not a sign of treatment failure; it will take a long time to resolve. Your caregiver may recommend a number of commercial preparations or medication by mouth if home care is not working. Recurrence is common and preventative medication may be necessary. This skin condition is not highly contagious. Special care is not  needed to protect close friends and family members. Normal hygiene is usually enough. Follow up is required only if you develop complications (such as a secondary infection from scratching), if recommended by your caregiver, or if no relief is obtained from the preparations used. Document Released: 12/02/2000 Document Revised: 02/27/2012 Document Reviewed: 01/14/2009 [facility] Patient Information 2015 [place], [place]. This information is not intended to replace advice given to you by your health care provider. Make sure you discuss any questions you have with your health care provider.

## 2015-06-04 NOTE — Progress Notes (Signed)
   Subjective:    Patient ID: Alexander Perez, male    DOB: Jan 24, 1986, 29 y.o.   MRN: [id number]  HPI  29 year old patient who has had a rash since 2012.  He states a brother and both parents have a very similar rash.  It is pruritic and involves the torso area only, both anteriorly and posteriorly and spares the extremities  Past Medical History  Diagnosis Date  . Positive PPD     cxr - normal , no tx required    History   Social History  . Marital Status: Single    Spouse Name: N/A  . Number of Children: N/A  . Years of Education: N/A   Occupational History  . Not on file.   Social History Main Topics  . Smoking status: Current Every Day Smoker -- 0.30 packs/day    Types: Cigarettes  . Smokeless tobacco: Never Used     Comment: 1 cigarette a day  . Alcohol Use: 0.0 oz/week    0 Standard drinks or equivalent per week     Comment: occ  . Drug Use: No  . Sexual Activity: Not on file   Other Topics Concern  . Not on file   Social History Narrative    No past surgical history on file.  Family History  Problem Relation Age of Onset  . Hypertension Mother   . Diabetes Paternal Grandmother     No Known Allergies  Current Outpatient Prescriptions on File Prior to Visit  Medication Sig Dispense Refill  . Multiple Vitamin (MULTIVITAMIN) tablet Take 1 tablet by mouth daily.    . NON FORMULARY Take 1 capsule by mouth as needed. Alpha Brain     No current facility-administered medications on file prior to visit.    BP 146/90 mmHg  Pulse 85  Temp(Src) 97.7 F (36.5 C) (Oral)  Resp 20  Ht 6' 0.5" (1.842 m)  Wt 200 lb (90.719 kg)  BMI 26.74 kg/m2  SpO2 98%     Review of Systems  Constitutional: Negative for fever, chills, appetite change and fatigue.  HENT: Negative for congestion, dental problem, ear pain, hearing loss, sore throat, tinnitus, trouble swallowing and voice change.   Eyes: Negative for pain, discharge and visual disturbance.  Respiratory:  Negative for cough, chest tightness, wheezing and stridor.   Cardiovascular: Negative for chest pain, palpitations and leg swelling.  Gastrointestinal: Negative for nausea, vomiting, abdominal pain, diarrhea, constipation, blood in stool and abdominal distention.  Genitourinary: Negative for urgency, hematuria, flank pain, discharge, difficulty urinating and genital sores.  Musculoskeletal: Negative for myalgias, back pain, joint swelling, arthralgias, gait problem and neck stiffness.  Skin: Positive for rash.  Neurological: Negative for dizziness, syncope, speech difficulty, weakness, numbness and headaches.  Hematological: Negative for adenopathy. Does not bruise/bleed easily.  Psychiatric/Behavioral: Negative for behavioral problems and dysphoric mood. The patient is not nervous/anxious.        Objective:   Physical Exam  Constitutional: He appears well-developed and well-nourished. No distress.  Skin:  Diffuse macular rash involving the torso regions, both anteriorly and posteriorly.  Rash is the macular at times confluent involving large areas and slightly scaly          Assessment & Plan:   Suspect tinea versicolor.  Will try topical selenium sulfide 2.5 percent as well as oral fluconazole 300 mg weekly for 2 weeks

## 2015-06-04 NOTE — Progress Notes (Signed)
Pre visit review using our clinic review tool, if applicable. No additional management support is needed unless otherwise documented below in the visit note. 

## 2015-11-20 ENCOUNTER — Ambulatory Visit (INDEPENDENT_AMBULATORY_CARE_PROVIDER_SITE_OTHER): Payer: Managed Care, Other (non HMO) | Admitting: Family Medicine

## 2015-11-20 ENCOUNTER — Ambulatory Visit (INDEPENDENT_AMBULATORY_CARE_PROVIDER_SITE_OTHER)
Admission: RE | Admit: 2015-11-20 | Discharge: 2015-11-20 | Disposition: A | Discharge status: Home / Self Care | Payer: Managed Care, Other (non HMO) | Source: Ambulatory Visit | Attending: Family Medicine | Admitting: Family Medicine

## 2015-11-20 ENCOUNTER — Encounter: Payer: Self-pay | Admitting: Family Medicine

## 2015-11-20 VITALS — BP 128/88 | HR 97 | Temp 97.7°F | Ht 72.5 in | Wt 200.1 lb

## 2015-11-20 DIAGNOSIS — M79672 Pain in left foot: Secondary | ICD-10-CM | POA: Diagnosis not present

## 2015-11-20 NOTE — Progress Notes (Signed)
Pre visit review using our clinic review tool, if applicable. No additional management support is needed unless otherwise documented below in the visit note. 

## 2015-11-20 NOTE — Patient Instructions (Signed)
BEFORE YOU LEAVE: -xray sheet -schedule follow up in 3-4 weeks  Go get xray  Aleve 1-2 times daily as needed for pain  Avoid aggravating activities until feeling better

## 2015-11-20 NOTE — Progress Notes (Signed)
  HPI:  Acute visit for:  L foot pain: -started yesterday after heavy bag workout the day before -symptoms: pain in dorsal foot -able to bear weight with only mild pain, but with certain ext movements of foot has sharp pain -denies: fevers, malaise, trauma, weakness, numbness, sig swelling -hx of broken ankle and he is very worried that he fractured his foot  ROS: See pertinent positives and negatives per HPI.  Past Medical History  Diagnosis Date  . Positive PPD     cxr - normal , no tx required    No past surgical history on file.  Family History  Problem Relation Age of Onset  . Hypertension Mother   . Diabetes Paternal Grandmother     Social History   Social History  . Marital Status: Single    Spouse Name: N/A  . Number of Children: N/A  . Years of Education: N/A   Social History Main Topics  . Smoking status: Current Every Day Smoker -- 0.30 packs/day    Types: Cigarettes  . Smokeless tobacco: Never Used     Comment: 1 cigarette a day  . Alcohol Use: 0.0 oz/week    0 Standard drinks or equivalent per week     Comment: occ  . Drug Use: No  . Sexual Activity: Not Asked   Other Topics Concern  . None   Social History Narrative     Current outpatient prescriptions:  Marland Kitchen.  Multiple Vitamin (MULTIVITAMIN) tablet, Take 1 tablet by mouth daily., Disp: , Rfl:   EXAM:  Filed Vitals:   11/20/15 1251  BP: 128/88  Pulse: 97  Temp: 97.7 F (36.5 C)    Body mass index is 26.75 kg/(m^2).  GENERAL: vitals reviewed and listed above, alert, oriented, appears well hydrated and in no acute distress  HEENT: atraumatic, conjunttiva clear, no obvious abnormalities on inspection of external nose and ears  NECK: no obvious masses on inspection  MS: moves all extremities without noticeable abnormality, normal gait, normal appearance of both feet except for bilat scar and mild erythema in small area on dorsal feet bilaterally where shoe hits - this is symmetrical,  there is no swelling, edema, warmth ecchymosis; point TTP over the dorsal aspect of the proximal 3rd metatarsal, neg talar til, neg ankle drawer test, neg squeeze test, normal movement of toes and ankle and foot bilaterally, normal cap refill  PSYCH: pleasant and cooperative, no obvious depression or anxiety  ASSESSMENT AND PLAN:  Discussed the following assessment and plan:  Left foot pain - Plan: DG Foot Complete Left  -suspect soft tissue injury with tendonopathy vs soft tissue contusion most likely -plain films per his concerns to r/o fx/comp fx  -tx with NSAIDS/ice, modification of activities -follow up in 4 weeks -However, atient advised to return or notify a doctor immediately if symptoms worsen or new concerns arise.  Patient Instructions  BEFORE YOU LEAVE: -xray sheet -schedule follow up in 3-4 weeks  Go get xray  Aleve 1-2 times daily as needed for pain  Avoid aggravating activities until feeling better      Kriste BasqueKIM, HANNAH R.

## 2016-10-11 ENCOUNTER — Other Ambulatory Visit (INDEPENDENT_AMBULATORY_CARE_PROVIDER_SITE_OTHER): Payer: Managed Care, Other (non HMO)

## 2016-10-11 DIAGNOSIS — R7989 Other specified abnormal findings of blood chemistry: Secondary | ICD-10-CM

## 2016-10-11 DIAGNOSIS — Z0000 Encounter for general adult medical examination without abnormal findings: Secondary | ICD-10-CM

## 2016-10-11 LAB — CBC WITH DIFFERENTIAL/PLATELET
BASOS PCT: 0.3 % (ref 0.0–3.0)
Basophils Absolute: 0 10*3/uL (ref 0.0–0.1)
EOS PCT: 1.7 % (ref 0.0–5.0)
Eosinophils Absolute: 0.1 10*3/uL (ref 0.0–0.7)
HCT: 44.4 % (ref 39.0–52.0)
Hemoglobin: 15.3 g/dL (ref 13.0–17.0)
LYMPHS ABS: 2.3 10*3/uL (ref 0.7–4.0)
Lymphocytes Relative: 27.8 % (ref 12.0–46.0)
MCHC: 34.3 g/dL (ref 30.0–36.0)
MCV: 90.4 fl (ref 78.0–100.0)
MONOS PCT: 8.1 % (ref 3.0–12.0)
Monocytes Absolute: 0.7 10*3/uL (ref 0.1–1.0)
NEUTROS PCT: 62.1 % (ref 43.0–77.0)
Neutro Abs: 5.1 10*3/uL (ref 1.4–7.7)
Platelets: 206 10*3/uL (ref 150.0–400.0)
RBC: 4.91 Mil/uL (ref 4.22–5.81)
RDW: 13 % (ref 11.5–15.5)
WBC: 8.1 10*3/uL (ref 4.0–10.5)

## 2016-10-11 LAB — POC URINALSYSI DIPSTICK (AUTOMATED)
BILIRUBIN UA: NEGATIVE
Blood, UA: NEGATIVE
GLUCOSE UA: NEGATIVE
Ketones, UA: NEGATIVE
LEUKOCYTES UA: NEGATIVE
NITRITE UA: NEGATIVE
Protein, UA: NEGATIVE
Spec Grav, UA: 1.025
Urobilinogen, UA: 0.2
pH, UA: 5.5

## 2016-10-11 LAB — HEPATIC FUNCTION PANEL
ALBUMIN: 4.9 g/dL (ref 3.5–5.2)
ALT: 10 U/L (ref 0–53)
AST: 21 U/L (ref 0–37)
Alkaline Phosphatase: 74 U/L (ref 39–117)
Bilirubin, Direct: 0.1 mg/dL (ref 0.0–0.3)
Total Bilirubin: 0.3 mg/dL (ref 0.2–1.2)
Total Protein: 7.1 g/dL (ref 6.0–8.3)

## 2016-10-11 LAB — BASIC METABOLIC PANEL
BUN: 16 mg/dL (ref 6–23)
CHLORIDE: 103 meq/L (ref 96–112)
CO2: 28 meq/L (ref 19–32)
Calcium: 10.2 mg/dL (ref 8.4–10.5)
Creatinine, Ser: 1.11 mg/dL (ref 0.40–1.50)
GFR: 82.65 mL/min (ref >60.00)
GLUCOSE: 94 mg/dL (ref 70–99)
POTASSIUM: 4.5 meq/L (ref 3.5–5.1)
SODIUM: 141 meq/L (ref 135–145)

## 2016-10-11 LAB — LDL CHOLESTEROL, DIRECT: Direct LDL: 88 mg/dL

## 2016-10-11 LAB — TSH: TSH: 3.58 u[IU]/mL (ref 0.35–4.50)

## 2016-10-11 LAB — LIPID PANEL
CHOL/HDL RATIO: 4
CHOLESTEROL: 227 mg/dL — AB (ref 0–200)
HDL: 58.9 mg/dL (ref >39.00)
Triglycerides: 935 mg/dL — ABNORMAL HIGH (ref 0.0–149.0)

## 2016-10-19 ENCOUNTER — Other Ambulatory Visit: Payer: Self-pay | Admitting: Adult Health

## 2016-10-19 ENCOUNTER — Ambulatory Visit (INDEPENDENT_AMBULATORY_CARE_PROVIDER_SITE_OTHER)
Admission: RE | Admit: 2016-10-19 | Discharge: 2016-10-19 | Disposition: A | Discharge status: Home / Self Care | Payer: Managed Care, Other (non HMO) | Source: Ambulatory Visit | Attending: Adult Health | Admitting: Adult Health

## 2016-10-19 ENCOUNTER — Encounter: Payer: Self-pay | Admitting: Adult Health

## 2016-10-19 ENCOUNTER — Ambulatory Visit (INDEPENDENT_AMBULATORY_CARE_PROVIDER_SITE_OTHER): Payer: Managed Care, Other (non HMO) | Admitting: Adult Health

## 2016-10-19 VITALS — BP 152/98 | Ht 72.5 in | Wt 201.0 lb

## 2016-10-19 DIAGNOSIS — M79671 Pain in right foot: Secondary | ICD-10-CM | POA: Diagnosis not present

## 2016-10-19 MED ORDER — CEPHALEXIN 500 MG PO CAPS: 500.0000 mg | ORAL_CAPSULE | Freq: Three times a day (TID) | ORAL | 0 refills | Status: DC

## 2016-10-19 NOTE — Progress Notes (Signed)
Subjective:    Patient ID: Alexander Perez, male    DOB: [birth year], 30 y.o.   MRN: [id number]  HPI  30 year old male who presents with 3 days of worsening right foot pain. He denies any trauma to the area. Has not been exercising or doing anything that could cause trauma to the area. The pain is described as " it just hurts". He has noticed swelling to the area and bruising. Pain is worse with certain movements such as dorsiflexion and walking. Pain has been getting worse over the course of three days until he woke up this morning and had a hard time walking.   Denies any numbness or tingling. Has no shortness of breath or chest pain   Review of Systems  Constitutional: Negative.   Respiratory: Negative.   Cardiovascular: Negative.   Musculoskeletal: Positive for gait problem and joint swelling.  Skin: Positive for color change.  All other systems reviewed and are negative.  Past Medical History:  Diagnosis Date  . Positive PPD    cxr - normal , no tx required    Social History   Social History  . Marital status: Single    Spouse name: N/A  . Number of children: N/A  . Years of education: N/A   Occupational History  . Not on file.   Social History Main Topics  . Smoking status: Current Every Day Smoker    Packs/day: 0.30    Types: Cigarettes  . Smokeless tobacco: Never Used     Comment: 1 cigarette a day  . Alcohol use 0.0 oz/week     Comment: occ  . Drug use: No  . Sexual activity: Not on file   Other Topics Concern  . Not on file   Social History Narrative  . No narrative on file    No past surgical history on file.  Family History  Problem Relation Age of Onset  . Hypertension Mother   . Diabetes Paternal Grandmother     No Known Allergies  Current Outpatient Prescriptions on File Prior to Visit  Medication Sig Dispense Refill  . Multiple Vitamin (MULTIVITAMIN) tablet Take 1 tablet by mouth daily.     No current facility-administered medications  on file prior to visit.     BP (!) 152/98   Ht 6' 0.5" (1.842 m)   Wt 201 lb (91.2 kg)   BMI 26.89 kg/m       Objective:   Physical Exam  Constitutional: He is oriented to person, place, and time. He appears well-developed and well-nourished. No distress.  Cardiovascular: Normal rate, regular rhythm, normal heart sounds and intact distal pulses.  Exam reveals no gallop and no friction rub.   No murmur heard. Pulmonary/Chest: Effort normal and breath sounds normal. No respiratory distress. He has no wheezes. He has no rales. He exhibits no tenderness.  Musculoskeletal: Normal range of motion. He exhibits edema (trace edema to distal part of right foot) and tenderness (tenderness with palpation to distal area of right foot). He exhibits no deformity.  No redness, warmth, pain or edema to right calf or ankle.   Neurological: He is alert and oriented to person, place, and time.  Skin: Skin is warm and dry. No rash noted. He is not diaphoretic. There is erythema (trace redness and warmth to distal aspect of right foot). No pallor.  Psychiatric: He has a normal mood and affect. His behavior is normal. Judgment and thought content normal.  Nursing note and vitals reviewed.  Assessment & Plan:  1. Right foot pain - Will r/o stress fracture, possible cellulitis. Low suspicion for DVT  - DG Foot Complete Right; Future - Consider doxycycline if X ray is negative - Consider ortho referral   Shirline Freesory Keshawn Fiorito, NP

## 2016-10-19 NOTE — Patient Instructions (Signed)
It was great meeting you today   I will follow up with you regarding the foot x ray   Take motrin, up to 800 mg every 8 hours and ice the foot for 20 minutes at a time.

## 2016-10-24 ENCOUNTER — Encounter: Payer: Managed Care, Other (non HMO) | Admitting: Internal Medicine

## 2016-10-27 ENCOUNTER — Encounter: Payer: Self-pay | Admitting: Internal Medicine

## 2016-10-27 ENCOUNTER — Ambulatory Visit (INDEPENDENT_AMBULATORY_CARE_PROVIDER_SITE_OTHER): Payer: Managed Care, Other (non HMO) | Admitting: Internal Medicine

## 2016-10-27 VITALS — BP 148/96 | HR 91 | Temp 98.1°F | Resp 20 | Ht 72.0 in | Wt 200.0 lb

## 2016-10-27 DIAGNOSIS — Z23 Encounter for immunization: Secondary | ICD-10-CM

## 2016-10-27 DIAGNOSIS — Z0000 Encounter for general adult medical examination without abnormal findings: Secondary | ICD-10-CM

## 2016-10-27 NOTE — Progress Notes (Signed)
   Subjective:    Patient ID: Alexander Perez, male    DOB: 12-20-1985, 30 y.o.   MRN: [id number]  HPI 30 year old patient who is seen today for a preventive health exam  Does have a history of ADHD which he took during school but no longer requires. He has a remote history of positive PPD. He takes no chronic medications. He does have a history of occasional low back pain that has not been an issue in some time. He takes no chronic medications.  Laboratory studies reviewed that revealed significant hypertriglyceridemia with a favorable HDL and LDL cholesterol  Family history fairly noncontributory.  Both parents are approximately age 30.  Father in good health.  Mother has hypertension and hypothyroidism.  One younger brother is in good health  Social history.  Rare tobacco use to 3 cigarettes per week  Review of Systems  Constitutional: Negative for activity change, appetite change, chills, fatigue and fever.  HENT: Negative for congestion, dental problem, ear pain, hearing loss, mouth sores, rhinorrhea, sinus pressure, sneezing, tinnitus, trouble swallowing and voice change.   Eyes: Negative for photophobia, pain, redness and visual disturbance.  Respiratory: Negative for apnea, cough, choking, chest tightness, shortness of breath and wheezing.   Cardiovascular: Negative for chest pain, palpitations and leg swelling.  Gastrointestinal: Negative for abdominal distention, abdominal pain, anal bleeding, blood in stool, constipation, diarrhea, nausea, rectal pain and vomiting.  Genitourinary: Negative for decreased urine volume, difficulty urinating, discharge, dysuria, flank pain, frequency, genital sores, hematuria, penile swelling, scrotal swelling, testicular pain and urgency.  Musculoskeletal: Negative for arthralgias, back pain, gait problem, joint swelling, myalgias, neck pain and neck stiffness.  Skin: Negative for color change, rash and wound.  Neurological: Negative for dizziness,  tremors, seizures, syncope, facial asymmetry, speech difficulty, weakness, light-headedness, numbness and headaches.  Hematological: Negative for adenopathy. Does not bruise/bleed easily.  Psychiatric/Behavioral: Negative for agitation, behavioral problems, confusion, decreased concentration, dysphoric mood, hallucinations, self-injury, sleep disturbance and suicidal ideas. The patient is not nervous/anxious.        Objective:   Physical Exam  Constitutional: He appears well-developed and well-nourished.  HENT:  Head: Normocephalic and atraumatic.  Right Ear: External ear normal.  Left Ear: External ear normal.  Nose: Nose normal.  Mouth/Throat: Oropharynx is clear and moist.  Eyes: Conjunctivae and EOM are normal. Pupils are equal, round, and reactive to light. No scleral icterus.  Neck: Normal range of motion. Neck supple. No JVD present. No thyromegaly present.  Cardiovascular: Regular rhythm, normal heart sounds and intact distal pulses.  Exam reveals no gallop and no friction rub.   No murmur heard. Pulmonary/Chest: Effort normal and breath sounds normal. He exhibits no tenderness.  Abdominal: Soft. Bowel sounds are normal. He exhibits no distension and no mass. There is no tenderness.  Genitourinary: Penis normal.  Musculoskeletal: Normal range of motion. He exhibits no edema or tenderness.  Lymphadenopathy:    He has no cervical adenopathy.  Neurological: He is alert. He has normal reflexes. No cranial nerve deficit. Coordination normal.  Skin: Skin is warm and dry. No rash noted.  Scattered lesions over the chest wall consistent with tenia versicolor  Psychiatric: He has a normal mood and affect. His behavior is normal.          Assessment & Plan:   Preventive health exam Hypertriglyceridemia.  Patient was placed on diet.  Will intensify his exercise regimen and attempt some modest weight loss  Recheck one year  [email or web address],PETER FRANK

## 2016-10-27 NOTE — Progress Notes (Signed)
Pre visit review using our clinic review tool, if applicable. No additional management support is needed unless otherwise documented below in the visit note. 

## 2016-10-27 NOTE — Patient Instructions (Addendum)
Limit your sodium (Salt) intake    It is important that you exercise regularly, at least 20 minutes 3 to 4 times per week.  If you develop chest pain or shortness of breath seek  medical attention.  You need to lose weight.  Consider a lower calorie diet and regular exercise.  Food Choices to Lower Your Triglycerides Triglycerides are a type of fat in your blood. High levels of triglycerides can increase the risk of heart disease and stroke. If your triglyceride levels are high, the foods you eat and your eating habits are very important. Choosing the right foods can help lower your triglycerides.  WHAT GENERAL GUIDELINES DO I NEED TO FOLLOW?  Lose weight if you are overweight.   Limit or avoid alcohol.   Fill one half of your plate with vegetables and green salads.   Limit fruit to two servings a day. Choose fruit instead of juice.   Make one fourth of your plate whole grains. Look for the word "whole" as the first word in the ingredient list.  Fill one fourth of your plate with lean protein foods.  Enjoy fatty fish (such as salmon, mackerel, sardines, and tuna) three times a week.   Choose healthy fats.   Limit foods high in starch and sugar.  Eat more home-cooked food and less restaurant, buffet, and fast food.  Limit fried foods.  Cook foods using methods other than frying.  Limit saturated fats.  Check ingredient lists to avoid foods with partially hydrogenated oils (trans fats) in them. WHAT FOODS CAN I EAT?  Grains Whole grains, such as whole wheat or whole grain breads, crackers, cereals, and pasta. Unsweetened oatmeal, bulgur, barley, quinoa, or brown rice. Corn or whole wheat flour tortillas.  Vegetables Fresh or frozen vegetables (raw, steamed, roasted, or grilled). Green salads. Fruits All fresh, canned (in natural juice), or frozen fruits. Meat and Other Protein Products Ground beef (85% or leaner), grass-fed beef, or beef trimmed of fat. Skinless  chicken or [place]. Ground chicken or [place]. Pork trimmed of fat. All fish and seafood. Eggs. Dried beans, peas, or lentils. Unsalted nuts or seeds. Unsalted canned or dry beans. Dairy Low-fat dairy products, such as skim or 1% milk, 2% or reduced-fat cheeses, low-fat ricotta or cottage cheese, or plain low-fat yogurt. Fats and Oils Tub margarines without trans fats. Light or reduced-fat mayonnaise and salad dressings. Avocado. Safflower, olive, or canola oils. Natural peanut or almond butter. The items listed above may not be a complete list of recommended foods or beverages. Contact your dietitian for more options. WHAT FOODS ARE NOT RECOMMENDED?  Grains White bread. White pasta. White rice. Cornbread. Bagels, pastries, and croissants. Crackers that contain trans fat. Vegetables White potatoes. Corn. Creamed or fried vegetables. Vegetables in a cheese sauce. Fruits Dried fruits. Canned fruit in light or heavy syrup. Fruit juice. Meat and Other Protein Products Fatty cuts of meat. Ribs, chicken wings, bacon, sausage, bologna, salami, chitterlings, fatback, hot dogs, bratwurst, and packaged luncheon meats. Dairy Whole or 2% milk, cream, half-and-half, and cream cheese. Whole-fat or sweetened yogurt. Full-fat cheeses. Nondairy creamers and whipped toppings. Processed cheese, cheese spreads, or cheese curds. Sweets and Desserts Corn syrup, sugars, honey, and molasses. Candy. Jam and jelly. Syrup. Sweetened cereals. Cookies, pies, cakes, donuts, muffins, and ice cream. Fats and Oils Butter, stick margarine, lard, shortening, ghee, or bacon fat. Coconut, palm kernel, or palm oils. Beverages Alcohol. Sweetened drinks (such as sodas, lemonade, and fruit drinks or punches). The items listed  above may not be a complete list of foods and beverages to avoid. Contact your dietitian for more information.   This information is not intended to replace advice given to you by your health care provider. Make  sure you discuss any questions you have with your health care provider.   Document Released: 09/22/2004 Document Revised: 12/26/2014 Document Reviewed: 10/09/2013 Elsevier Interactive Patient Education [employer]2016 Elsevier Inc.

## 2017-03-08 ENCOUNTER — Encounter: Payer: Self-pay | Admitting: Family Medicine

## 2017-03-08 ENCOUNTER — Ambulatory Visit (INDEPENDENT_AMBULATORY_CARE_PROVIDER_SITE_OTHER): Payer: Managed Care, Other (non HMO) | Admitting: Family Medicine

## 2017-03-08 VITALS — BP 140/88 | HR 76 | Temp 98.3°F | Wt 208.9 lb

## 2017-03-08 DIAGNOSIS — F410 Panic disorder [episodic paroxysmal anxiety] without agoraphobia: Secondary | ICD-10-CM | POA: Diagnosis not present

## 2017-03-08 MED ORDER — SERTRALINE HCL 50 MG PO TABS: 50.0000 mg | ORAL_TABLET | Freq: Every day | ORAL | 5 refills | Status: DC

## 2017-03-08 NOTE — Patient Instructions (Signed)
Generalized Anxiety Disorder, Adult Generalized anxiety disorder (GAD) is a mental health disorder. People with this condition constantly worry about everyday events. Unlike normal anxiety, worry related to GAD is not triggered by a specific event. These worries also do not fade or get better with time. GAD interferes with life functions, including relationships, work, and school. GAD can vary from mild to severe. People with severe GAD can have intense waves of anxiety with physical symptoms (panic attacks). What are the causes? The exact cause of GAD is not known. What increases the risk? This condition is more likely to develop in:  Women.  People who have a family history of anxiety disorders.  People who are very shy.  People who experience very stressful life events, such as the death of a loved one.  People who have a very stressful family environment. What are the signs or symptoms? People with GAD often worry excessively about many things in their lives, such as their health and family. They may also be overly concerned about:  Doing well at work.  Being on time.  Natural disasters.  Friendships. Physical symptoms of GAD include:  Fatigue.  Muscle tension or having muscle twitches.  Trembling or feeling shaky.  Being easily startled.  Feeling like your heart is pounding or racing.  Feeling out of breath or like you cannot take a deep breath.  Having trouble falling asleep or staying asleep.  Sweating.  Nausea, diarrhea, or irritable bowel syndrome (IBS).  Headaches.  Trouble concentrating or remembering facts.  Restlessness.  Irritability. How is this diagnosed? Your health care provider can diagnose GAD based on your symptoms and medical history. You will also have a physical exam. The health care provider will ask specific questions about your symptoms, including how severe they are, when they started, and if they come and go. Your health care  provider may ask you about your use of alcohol or drugs, including prescription medicines. Your health care provider may refer you to a mental health specialist for further evaluation. Your health care provider will do a thorough examination and may perform additional tests to rule out other possible causes of your symptoms. To be diagnosed with GAD, a person must have anxiety that:  Is out of his or her control.  Affects several different aspects of his or her life, such as work and relationships.  Causes distress that makes him or her unable to take part in normal activities.  Includes at least three physical symptoms of GAD, such as restlessness, fatigue, trouble concentrating, irritability, muscle tension, or sleep problems. Before your health care provider can confirm a diagnosis of GAD, these symptoms must be present more days than they are not, and they must last for six months or longer. How is this treated? The following therapies are usually used to treat GAD:  Medicine. Antidepressant medicine is usually prescribed for long-term daily control. Antianxiety medicines may be added in severe cases, especially when panic attacks occur.  Talk therapy (psychotherapy). Certain types of talk therapy can be helpful in treating GAD by providing support, education, and guidance. Options include:  Cognitive behavioral therapy (CBT). People learn coping skills and techniques to ease their anxiety. They learn to identify unrealistic or negative thoughts and behaviors and to replace them with positive ones.  Acceptance and commitment therapy (ACT). This treatment teaches people how to be mindful as a way to cope with unwanted thoughts and feelings.  Biofeedback. This process trains you to manage your body's response (  physiological response) through breathing techniques and relaxation methods. You will work with a therapist while machines are used to monitor your physical symptoms.  Stress  management techniques. These include yoga, meditation, and exercise. A mental health specialist can help determine which treatment is best for you. Some people see improvement with one type of therapy. However, other people require a combination of therapies. Follow these instructions at home:  Take over-the-counter and prescription medicines only as told by your health care provider.  Try to maintain a normal routine.  Try to anticipate stressful situations and allow extra time to manage them.  Practice any stress management or self-calming techniques as taught by your health care provider.  Do not punish yourself for setbacks or for not making progress.  Try to recognize your accomplishments, even if they are small.  Keep all follow-up visits as told by your health care provider. This is important. Contact a health care provider if:  Your symptoms do not get better.  Your symptoms get worse.  You have signs of depression, such as:  A persistently sad, cranky, or irritable mood.  Loss of enjoyment in activities that used to bring you joy.  Change in weight or eating.  Changes in sleeping habits.  Avoiding friends or family members.  Loss of energy for normal tasks.  Feelings of guilt or worthlessness. Get help right away if:  You have serious thoughts about hurting yourself or others. If you ever feel like you may hurt yourself or others, or have thoughts about taking your own life, get help right away. You can go to your nearest emergency department or call:  Your local emergency services (911 in the [place]  A suicide crisis helpline, such as the National Suicide Prevention Lifeline at 1-800-273-8255. This is open 24 hours a day. Summary  Generalized anxiety disorder (GAD) is a mental health disorder that involves worry that is not triggered by a specific event.  People with GAD often worry excessively about many things in their lives, such as their health and  family.  GAD may cause physical symptoms such as restlessness, trouble concentrating, sleep problems, frequent sweating, nausea, diarrhea, headaches, and trembling or muscle twitching.  A mental health specialist can help determine which treatment is best for you. Some people see improvement with one type of therapy. However, other people require a combination of therapies. This information is not intended to replace advice given to you by your health care provider. Make sure you discuss any questions you have with your health care provider. Document Released: 04/01/2013 Document Revised: 10/25/2016 Document Reviewed: 10/25/2016 Elsevier Interactive Patient Education  2017 Elsevier Inc.  

## 2017-03-08 NOTE — Progress Notes (Signed)
Subjective:     Patient ID: Alexander Perez, male   DOB: 10-07-1986, 31 y.o.   MRN: [id number]  HPI Patient seen with frequent episodes that come on suddenly of multiple somatic symptoms including: dizziness, lightheadedness, nausea without vomiting, subjective tachycardia, chest pressure, sweats. He frequently wakes out of his sleep with these symptoms. These are becoming more frequent and more intense. Symptoms can last up to 2 hours.  He's been able to exercise without any difficulty including running. Minimal caffeine use. No regular alcohol use. No illicit drug use.  He had physical last fall and normal TSH. Very high triglycerides and is now taking fish oil for that. His weight is actually up about 9 pounds since then. He denies any specific triggers for anxiety symptoms.  He has some chronic diarrhea which is unchanged and good appetite. He has some pervasive worries about "cancer" though he's had no weight change, adenopathy, night sweats, fever, or other specific findings/symptoms.  Past Medical History:  Diagnosis Date  . Positive PPD    cxr - normal , no tx required   No past surgical history on file.  reports that he has been smoking Cigarettes.  He has been smoking about 0.30 packs per day. He has never used smokeless tobacco. He reports that he drinks alcohol. He reports that he does not use drugs. family history includes Diabetes in his paternal grandmother; Hypertension in his mother. No Known Allergies   Review of Systems  Constitutional: Negative for appetite change, chills, fever and unexpected weight change.  Eyes: Negative for visual disturbance.  Respiratory: Negative for cough.   Cardiovascular: Positive for palpitations. Negative for leg swelling.  Gastrointestinal: Positive for nausea. Negative for abdominal pain.  Endocrine: Negative for polydipsia and polyuria.  Genitourinary: Negative for dysuria.  Neurological: Positive for dizziness, weakness and  light-headedness.  Hematological: Negative for adenopathy. Does not bruise/bleed easily.  Psychiatric/Behavioral: Negative for dysphoric mood. The patient is nervous/anxious.        Objective:   Physical Exam  Constitutional: He is oriented to person, place, and time. He appears well-developed and well-nourished.  HENT:  Right Ear: External ear normal.  Left Ear: External ear normal.  Mouth/Throat: Oropharynx is clear and moist.  Neck: Neck supple. No thyromegaly present.  Cardiovascular: Normal rate and regular rhythm.  Exam reveals no friction rub.   No murmur heard. Pulmonary/Chest: Effort normal and breath sounds normal. No respiratory distress. He has no wheezes. He has no rales.  Abdominal: Soft. There is no tenderness.  Musculoskeletal: He exhibits no edema.  Lymphadenopathy:    He has no cervical adenopathy.  Neurological: He is alert and oriented to person, place, and time. No cranial nerve deficit.  Skin: No rash noted.  Psychiatric: He has a normal mood and affect. His behavior is normal.       Assessment:     Patient presents with progressive several month history of discrete episodes of multiple somatic symptoms occurring in multiple settings which highly suggests panic disorder. He frequently wakes out of his sleep with these. He's not had any objective issues such as weight loss, fever, etc.  Recent labs unremarkable.     Plan:     -Trial of sertraline 50 mg once daily -Follow-up with primary in 3-4 weeks to reassess -Consider behavioral health referral for further counseling  Kristian CoveyBruce W Riven Beebe MD [facility]

## 2017-03-08 NOTE — Progress Notes (Signed)
Pre visit review using our clinic review tool, if applicable. No additional management support is needed unless otherwise documented below in the visit note. 

## 2017-09-07 ENCOUNTER — Encounter: Payer: Self-pay | Admitting: Internal Medicine

## 2018-02-27 ENCOUNTER — Encounter: Payer: Self-pay | Admitting: Internal Medicine

## 2018-02-27 ENCOUNTER — Ambulatory Visit (INDEPENDENT_AMBULATORY_CARE_PROVIDER_SITE_OTHER): Payer: Managed Care, Other (non HMO) | Admitting: Internal Medicine

## 2018-02-27 VITALS — BP 154/98 | HR 73 | Temp 97.8°F | Resp 18 | Ht 72.5 in | Wt 207.4 lb

## 2018-02-27 DIAGNOSIS — E781 Pure hyperglyceridemia: Secondary | ICD-10-CM

## 2018-02-27 DIAGNOSIS — Z0000 Encounter for general adult medical examination without abnormal findings: Secondary | ICD-10-CM | POA: Diagnosis not present

## 2018-02-27 LAB — LIPID PANEL
Cholesterol: 191 mg/dL (ref 0–200)
HDL: 55.3 mg/dL (ref >39.00)
NonHDL: 135.71
Total CHOL/HDL Ratio: 3
Triglycerides: 385 mg/dL — ABNORMAL HIGH (ref 0.0–149.0)
VLDL: 77 mg/dL — ABNORMAL HIGH (ref 0.0–40.0)

## 2018-02-27 LAB — LDL CHOLESTEROL, DIRECT: LDL DIRECT: 87 mg/dL

## 2018-02-27 MED ORDER — MEFLOQUINE HCL 250 MG PO TABS: 250.0000 mg | ORAL_TABLET | ORAL | 0 refills | Status: DC

## 2018-02-27 NOTE — Progress Notes (Signed)
Subjective:    Patient ID: Alexander Perez, male    DOB: 1986/11/29, 32 y.o.   MRN: [id number]  HPI  32 year old patient who is seen today for a health maintenance examination. He has a history of hypertriglyceridemia.  Interestingly, triglyceride level was 935 in the setting of a favorable LDL and HDL cholesterol.  He has been following a appropriate diet and also is on omega-3 fatty acids. He states that he had some blood drawn at work and he feels that his triglyceride level was in the 300 range.  He feels well He does have a history of ADHD but has not required medications in some time He has been on sertraline in the past but this medication has been self discontinued.  He has discontinued tobacco products.  He describes some situational stress at work but otherwise doing well He is planning on a visit to [place].  Current CDC immunization recommendations reviewed.  Patient has had hepatitis A vaccine in the service but will need malaria prophylaxis  Family history fairly noncontributory.  Both parents are approximately age 56.  Father in good health.  Mother has hypertension and hypothyroidism.  One younger brother is in good health   Review of Systems  Constitutional: Negative for appetite change, chills, fatigue and fever.  HENT: Negative for congestion, dental problem, ear pain, hearing loss, sore throat, tinnitus, trouble swallowing and voice change.   Eyes: Negative for pain, discharge and visual disturbance.  Respiratory: Negative for cough, chest tightness, wheezing and stridor.   Cardiovascular: Negative for chest pain, palpitations and leg swelling.  Gastrointestinal: Negative for abdominal distention, abdominal pain, blood in stool, constipation, diarrhea, nausea and vomiting.  Genitourinary: Negative for difficulty urinating, discharge, flank pain, genital sores, hematuria and urgency.  Musculoskeletal: Negative for arthralgias, back pain, gait problem, joint swelling,  myalgias and neck stiffness.  Skin: Negative for rash.  Neurological: Negative for dizziness, syncope, speech difficulty, weakness, numbness and headaches.  Hematological: Negative for adenopathy. Does not bruise/bleed easily.  Psychiatric/Behavioral: Negative for behavioral problems and dysphoric mood. The patient is not nervous/anxious.        Objective:   Physical Exam  Constitutional: He appears well-developed and well-nourished.  Blood pressure 135/85  HENT:  Head: Normocephalic and atraumatic.  Right Ear: External ear normal.  Left Ear: External ear normal.  Nose: Nose normal.  Mouth/Throat: Oropharynx is clear and moist.  Eyes: Conjunctivae and EOM are normal. Pupils are equal, round, and reactive to light. No scleral icterus.  Neck: Normal range of motion. Neck supple. No JVD present. No thyromegaly present.  Cardiovascular: Regular rhythm, normal heart sounds and intact distal pulses. Exam reveals no gallop and no friction rub.  No murmur heard. Pulmonary/Chest: Effort normal and breath sounds normal. He exhibits no tenderness.  Abdominal: Soft. Bowel sounds are normal. He exhibits no distension and no mass. There is no tenderness.  Genitourinary: Penis normal.  Genitourinary Comments: Uncircumcised  Musculoskeletal: Normal range of motion. He exhibits no edema or tenderness.  Lymphadenopathy:    He has no cervical adenopathy.  Neurological: He is alert. He has normal reflexes. No cranial nerve deficit. Coordination normal.  Skin: Skin is warm and dry. No rash noted.  Psychiatric: He has a normal mood and affect. His behavior is normal.          Assessment & Plan:  Preventive health examination  Hypertriglyceridemia.  Will review a lipid profile continue omega-3 fatty acids will consider fiber therapy  Will need malaria chemoprophylaxis  Follow-up 1 year or as needed More intensive exercise regimen encouraged   home blood pressure monitoring  recommended  Rogelia BogaKWIATKOWSKI,PETER FRANK

## 2018-02-27 NOTE — Patient Instructions (Addendum)
Food Choices to Lower Your Triglycerides Triglycerides are a type of fat in your blood. High levels of triglycerides can increase the risk of heart disease and stroke. If your triglyceride levels are high, the foods you eat and your eating habits are very important. Choosing the right foods can help lower your triglycerides. What general guidelines do I need to follow?  Lose weight if you are overweight.  Limit or avoid alcohol.  Fill one half of your plate with vegetables and green salads.  Limit fruit to two servings a day. Choose fruit instead of juice.  Make one fourth of your plate whole grains. Look for the word "whole" as the first word in the ingredient list.  Fill one fourth of your plate with lean protein foods.  Enjoy fatty fish (such as salmon, mackerel, sardines, and tuna) three times a week.  Choose healthy fats.  Limit foods high in starch and sugar.  Eat more home-cooked food and less restaurant, buffet, and fast food.  Limit fried foods.  Cook foods using methods other than frying.  Limit saturated fats.  Check ingredient lists to avoid foods with partially hydrogenated oils (trans fats) in them. What foods can I eat? Grains Whole grains, such as whole wheat or whole grain breads, crackers, cereals, and pasta. Unsweetened oatmeal, bulgur, barley, quinoa, or brown rice. Corn or whole wheat flour tortillas. Vegetables Fresh or frozen vegetables (raw, steamed, roasted, or grilled). Green salads. Fruits All fresh, canned (in natural juice), or frozen fruits. Meat and Other Protein Products Ground beef (85% or leaner), grass-fed beef, or beef trimmed of fat. Skinless chicken or [place]. Ground chicken or [place]. Pork trimmed of fat. All fish and seafood. Eggs. Dried beans, peas, or lentils. Unsalted nuts or seeds. Unsalted canned or dry beans. Dairy Low-fat dairy products, such as skim or 1% milk, 2% or reduced-fat cheeses, low-fat ricotta or cottage cheese, or  plain low-fat yogurt. Fats and Oils Tub margarines without trans fats. Light or reduced-fat mayonnaise and salad dressings. Avocado. Safflower, olive, or canola oils. Natural peanut or almond butter. The items listed above may not be a complete list of recommended foods or beverages. Contact your dietitian for more options. What foods are not recommended? Grains White bread. White pasta. White rice. Cornbread. Bagels, pastries, and croissants. Crackers that contain trans fat. Vegetables White potatoes. Corn. Creamed or fried vegetables. Vegetables in a cheese sauce. Fruits Dried fruits. Canned fruit in light or heavy syrup. Fruit juice. Meat and Other Protein Products Fatty cuts of meat. Ribs, chicken wings, bacon, sausage, bologna, salami, chitterlings, fatback, hot dogs, bratwurst, and packaged luncheon meats. Dairy Whole or 2% milk, cream, half-and-half, and cream cheese. Whole-fat or sweetened yogurt. Full-fat cheeses. Nondairy creamers and whipped toppings. Processed cheese, cheese spreads, or cheese curds. Sweets and Desserts Corn syrup, sugars, honey, and molasses. Candy. Jam and jelly. Syrup. Sweetened cereals. Cookies, pies, cakes, donuts, muffins, and ice cream. Fats and Oils Butter, stick margarine, lard, shortening, ghee, or bacon fat. Coconut, palm kernel, or palm oils. Beverages Alcohol. Sweetened drinks (such as sodas, lemonade, and fruit drinks or punches). The items listed above may not be a complete list of foods and beverages to avoid. Contact your dietitian for more information. This information is not intended to replace advice given to you by your health care provider. Make sure you discuss any questions you have with your health care provider. Document Released: 09/22/2004 Document Revised: 05/12/2016 Document Reviewed: 10/09/2013 Elsevier Interactive Patient Education  2017 [employer].  Limit your sodium (Salt) intake  Please check your blood pressure on a  regular basis.  If it is consistently greater than 140/85 , please make an office appointment.

## 2018-02-28 NOTE — Progress Notes (Signed)
Results uploaded via MYChart. Left message for patient to return phone call.

## 2019-05-02 ENCOUNTER — Other Ambulatory Visit: Payer: Self-pay

## 2019-05-02 ENCOUNTER — Ambulatory Visit (INDEPENDENT_AMBULATORY_CARE_PROVIDER_SITE_OTHER): Payer: Managed Care, Other (non HMO)

## 2019-05-02 ENCOUNTER — Encounter: Payer: Self-pay | Admitting: Internal Medicine

## 2019-05-02 ENCOUNTER — Ambulatory Visit (INDEPENDENT_AMBULATORY_CARE_PROVIDER_SITE_OTHER): Payer: Managed Care, Other (non HMO) | Admitting: Internal Medicine

## 2019-05-02 VITALS — BP 120/80 | HR 71 | Temp 97.5°F | Ht 73.0 in | Wt 206.4 lb

## 2019-05-02 DIAGNOSIS — R600 Localized edema: Secondary | ICD-10-CM | POA: Diagnosis not present

## 2019-05-02 MED ORDER — DOXYCYCLINE HYCLATE 100 MG PO TABS: 100.0000 mg | ORAL_TABLET | Freq: Two times a day (BID) | ORAL | 0 refills | Status: DC

## 2019-05-02 NOTE — Progress Notes (Signed)
Acute Office Visit     CC/Reason for Visit: left foot pain  HPI: Alexander Perez is a 33 y.o. male who is coming in today for the above mentioned reasons. Only significant PMH is hypertriglyceridemia for which he takes fish oil. Went to bed as normal on Saturday night. Sunday morning when he woke up left foot was swollen and red. He does not recall any insect/tick bites, no injuries or ankle sprains, nothing fell on his foot. He has having difficulty walking. There is a "redder" area on the dorsum of his foot along the fifth metatarsal. No fevers/chills. Had been working outside on Saturday.   Past Medical/Surgical History: Past Medical History:  Diagnosis Date  . Positive PPD    cxr - normal , no tx required    No past surgical history on file.  Social History:  reports that he has quit smoking. His smoking use included cigarettes. He started smoking about 2 years ago. He smoked 0.30 packs per day. He has never used smokeless tobacco. He reports current alcohol use. He reports that he does not use drugs.  Allergies: No Known Allergies  Family History:  Family History  Problem Relation Age of Onset  . Hypertension Mother   . Diabetes Paternal Grandmother      Current Outpatient Medications:  Marland Kitchen  Multiple Vitamin (MULTIVITAMIN) tablet, Take 1 tablet by mouth daily., Disp: , Rfl:  .  Omega-3 Fatty Acids (FISH OIL) 1000 MG CAPS, Take by mouth., Disp: , Rfl:  .  doxycycline (VIBRA-TABS) 100 MG tablet, Take 1 tablet (100 mg total) by mouth 2 (two) times daily., Disp: 20 tablet, Rfl: 0  Review of Systems:  Constitutional: Denies fever, chills, diaphoresis, appetite change and fatigue.  HEENT: Denies photophobia, eye pain, redness, hearing loss, ear pain, congestion, sore throat, rhinorrhea, sneezing, mouth sores, trouble swallowing, neck pain, neck stiffness and tinnitus.   Respiratory: Denies SOB, DOE, cough, chest tightness,  and wheezing.  Cardiovascular: Denies chest pain,  palpitations and leg swelling.  Gastrointestinal: Denies nausea, vomiting, abdominal pain, diarrhea, constipation, blood in stool and abdominal distention.  Genitourinary: Denies dysuria, urgency, frequency, hematuria, flank pain and difficulty urinating.  Endocrine: Denies: hot or cold intolerance, sweats, changes in hair or nails, polyuria, polydipsia. Musculoskeletal: Denies myalgias, back pain, joint swelling, arthralgias and gait problem.  Neurological: Denies dizziness, seizures, syncope, weakness, light-headedness, numbness and headaches.  Hematological: Denies adenopathy. Easy bruising, personal or family bleeding history  Psychiatric/Behavioral: Denies suicidal ideation, mood changes, confusion, nervousness, sleep disturbance and agitation    Physical Exam: Vitals:   05/02/19 1059  BP: 120/80  Pulse: 71  Temp: (!) 97.5 F (36.4 C)  TempSrc: Oral  SpO2: 99%  Weight: 206 lb 6.4 oz (93.6 kg)  Height: 6\' 1"  (1.854 m)    Body mass index is 27.23 kg/m.   Constitutional: NAD, calm, comfortable Eyes: PERRL, lids and conjunctivae normal ENMT: Mucous membranes are moist. Musculoskeletal: Right foot and ankle edema. Circular erythema along the fifth metatarsal on the dorsum of his foot. Skin: See below pics of left foot:         Psychiatric: Normal judgment and insight. Alert and oriented x 3. Normal mood.    Impression and Plan:  Edema of left foot -Suspect insect/tick bite as he had been working outside the day prior. -No ankle sprain/injury that he can recall. -Check xray. -Doxycycline BID for 10 days. -RTC if no improvement in 14 days.    Patient Instructions  [facility]  you feel better soon!  -Xray today; will notify you of results as soon as they are available.  -Take doxycycline twice a day for 7 days.  -May take bandage off daily for shower; may remove once swelling goes down.  -Schedule follow up in 2 weeks if no improvement.  -Schedule return visit  for your physical.     Chaya JanEstela Hernandez Acosta, MD  [facility]

## 2019-05-02 NOTE — Patient Instructions (Signed)
-  Hope you feel better soon!  -Xray today; will notify you of results as soon as they are available.  -Take doxycycline twice a day for 7 days.  -May take bandage off daily for shower; may remove once swelling goes down.  -Schedule follow up in 2 weeks if no improvement.  -Schedule return visit for your physical.

## 2019-05-04 ENCOUNTER — Other Ambulatory Visit: Payer: Self-pay

## 2019-05-04 ENCOUNTER — Emergency Department (HOSPITAL_BASED_OUTPATIENT_CLINIC_OR_DEPARTMENT_OTHER): Payer: Managed Care, Other (non HMO)

## 2019-05-04 ENCOUNTER — Emergency Department (HOSPITAL_COMMUNITY)
Admission: EM | Admit: 2019-05-04 | Discharge: 2019-05-04 | Disposition: A | Discharge status: Home / Self Care | Payer: Managed Care, Other (non HMO) | Attending: Emergency Medicine | Admitting: Emergency Medicine

## 2019-05-04 ENCOUNTER — Encounter (HOSPITAL_COMMUNITY): Payer: Self-pay | Admitting: Emergency Medicine

## 2019-05-04 DIAGNOSIS — M7989 Other specified soft tissue disorders: Secondary | ICD-10-CM

## 2019-05-04 DIAGNOSIS — M79672 Pain in left foot: Secondary | ICD-10-CM | POA: Diagnosis present

## 2019-05-04 DIAGNOSIS — L03116 Cellulitis of left lower limb: Secondary | ICD-10-CM

## 2019-05-04 DIAGNOSIS — Z79899 Other long term (current) drug therapy: Secondary | ICD-10-CM | POA: Diagnosis not present

## 2019-05-04 DIAGNOSIS — Z87891 Personal history of nicotine dependence: Secondary | ICD-10-CM | POA: Diagnosis not present

## 2019-05-04 DIAGNOSIS — M79609 Pain in unspecified limb: Secondary | ICD-10-CM | POA: Diagnosis not present

## 2019-05-04 LAB — COMPREHENSIVE METABOLIC PANEL
ALT: 12 U/L (ref 0–44)
AST: 20 U/L (ref 15–41)
Albumin: 4.3 g/dL (ref 3.5–5.0)
Alkaline Phosphatase: 77 U/L (ref 38–126)
Anion gap: 12 (ref 5–15)
BUN: 14 mg/dL (ref 6–20)
CO2: 22 mmol/L (ref 22–32)
Calcium: 9.3 mg/dL (ref 8.9–10.3)
Chloride: 104 mmol/L (ref 98–111)
Creatinine, Ser: 1 mg/dL (ref 0.61–1.24)
GFR calc Af Amer: >60 mL/min (ref >60)
GFR calc non Af Amer: >60 mL/min (ref >60)
Glucose, Bld: 95 mg/dL (ref 70–99)
Potassium: 3.9 mmol/L (ref 3.5–5.1)
Sodium: 138 mmol/L (ref 135–145)
Total Bilirubin: 0.8 mg/dL (ref 0.3–1.2)
Total Protein: 7.4 g/dL (ref 6.5–8.1)

## 2019-05-04 LAB — CBC WITH DIFFERENTIAL/PLATELET
Abs Immature Granulocytes: 0.06 10*3/uL (ref 0.00–0.07)
Basophils Absolute: 0 10*3/uL (ref 0.0–0.1)
Basophils Relative: 0 %
Eosinophils Absolute: 0.1 10*3/uL (ref 0.0–0.5)
Eosinophils Relative: 1 %
HCT: 46.3 % (ref 39.0–52.0)
Hemoglobin: 14.8 g/dL (ref 13.0–17.0)
Immature Granulocytes: 1 %
Lymphocytes Relative: 23 %
Lymphs Abs: 1.9 10*3/uL (ref 0.7–4.0)
MCH: 28.9 pg (ref 26.0–34.0)
MCHC: 32 g/dL (ref 30.0–36.0)
MCV: 90.4 fL (ref 80.0–100.0)
Monocytes Absolute: 0.7 10*3/uL (ref 0.1–1.0)
Monocytes Relative: 8 %
Neutro Abs: 5.4 10*3/uL (ref 1.7–7.7)
Neutrophils Relative %: 67 %
Platelets: 203 10*3/uL (ref 150–400)
RBC: 5.12 MIL/uL (ref 4.22–5.81)
RDW: 12.4 % (ref 11.5–15.5)
WBC: 8.1 10*3/uL (ref 4.0–10.5)
nRBC: 0 % (ref 0.0–0.2)

## 2019-05-04 MED ORDER — CEFAZOLIN SODIUM-DEXTROSE 1-4 GM/50ML-% IV SOLN
1.0000 g | Freq: Once | INTRAVENOUS | Status: AC
  Administered 2019-05-04: 1 g via INTRAVENOUS
  Filled 2019-05-04: qty 50

## 2019-05-04 MED ORDER — VANCOMYCIN HCL IN DEXTROSE 1-5 GM/200ML-% IV SOLN
1000.0000 mg | Freq: Once | INTRAVENOUS | Status: AC
  Administered 2019-05-04: 14:00:00 1000 mg via INTRAVENOUS
  Filled 2019-05-04: qty 200

## 2019-05-04 MED ORDER — HYDROCODONE-ACETAMINOPHEN 5-325 MG PO TABS: 1.0000 | ORAL_TABLET | ORAL | 0 refills | Status: DC | PRN

## 2019-05-04 NOTE — Discharge Instructions (Signed)
Continue doxycycline

## 2019-05-04 NOTE — Progress Notes (Signed)
Left lower extremity venous duplex completed.  05/04/2019 3:52 PM Gertie Fey, MHA, RVT, RDCS, RDMS

## 2019-05-04 NOTE — ED Provider Notes (Signed)
[facility]-EMERGENCY DEPT Provider Note   CSN: [id number] Arrival date & time: 05/04/19  1304    History   Chief Complaint Chief Complaint  Patient presents with  . Foot Pain  . Leg Pain    HPI Alexander Perez is a 33 y.o. male.     Pt presents to the ED today with left foot pain and redness.  Pt saw his doctor on 5/14.  He was put on doxy which has not helped.  They did xrays which were negative.  The pt said it is now more red and swollen.  He feels like it is extending up to his knee.  No f/c.  No sob/cp.  Nothing like this has ever happened to him in the past.  No known covid exposures.     Past Medical History:  Diagnosis Date  . Positive PPD    cxr - normal , no tx required    Patient Active Problem List   Diagnosis Date Noted  . ADD (attention deficit disorder with hyperactivity) 09/26/2011  . Positive PPD 09/26/2011    History reviewed. No pertinent surgical history.      Home Medications    Prior to Admission medications   Medication Sig Start Date End Date Taking? Authorizing Provider  doxycycline (VIBRA-TABS) 100 MG tablet Take 1 tablet (100 mg total) by mouth 2 (two) times daily. 05/02/19  Yes Philip AspenHernandez Acosta, Limmie PatriciaEstela Y, MD  ibuprofen (ADVIL) 200 MG tablet Take 200 mg by mouth every 6 (six) hours as needed for mild pain.   Yes [provider]  Multiple Vitamin (MULTIVITAMIN) tablet Take 1 tablet by mouth daily.   Yes [provider]  Omega-3 Fatty Acids (FISH OIL) 1000 MG CAPS Take 1,000 mg by mouth daily.    Yes [provider]  HYDROcodone-acetaminophen (NORCO/VICODIN) 5-325 MG tablet Take 1 tablet by mouth every 4 (four) hours as needed. 05/04/19   Jacalyn LefevreHaviland, Deloy Archey, MD    Family History Family History  Problem Relation Age of Onset  . Hypertension Mother   . Diabetes Paternal Grandmother     Social History Social History   Tobacco Use  . Smoking status: Former Smoker    Packs/day: 0.30    Types:  Cigarettes    Start date: 12/19/2016  . Smokeless tobacco: Never Used  Substance Use Topics  . Alcohol use: Yes    Alcohol/week: 0.0 standard drinks    Comment: occ  . Drug use: No     Allergies   Patient has no known allergies.   Review of Systems Review of Systems  Musculoskeletal:       Left foot pain and redness  All other systems reviewed and are negative.    Physical Exam Updated Vital Signs BP 134/88   Pulse 81   Temp 98.7 F (37.1 C) (Oral)   Resp 16   SpO2 98%   Physical Exam Vitals signs and nursing note reviewed.  Constitutional:      Appearance: Normal appearance.  HENT:     Head: Normocephalic and atraumatic.     Right Ear: External ear normal.     Left Ear: External ear normal.     Nose: Nose normal.     Mouth/Throat:     Mouth: Mucous membranes are moist.     Pharynx: Oropharynx is clear.  Eyes:     Extraocular Movements: Extraocular movements intact.     Conjunctiva/sclera: Conjunctivae normal.     Pupils: Pupils are equal, round, and  reactive to light.  Neck:     Musculoskeletal: Normal range of motion and neck supple.  Cardiovascular:     Rate and Rhythm: Normal rate and regular rhythm.     Pulses: Normal pulses.     Heart sounds: Normal heart sounds.  Pulmonary:     Effort: Pulmonary effort is normal.     Breath sounds: Normal breath sounds.  Abdominal:     General: Abdomen is flat. Bowel sounds are normal.     Palpations: Abdomen is soft.  Musculoskeletal: Normal range of motion.  Skin:    Capillary Refill: Capillary refill takes less than 2 seconds.     Comments: See picture.  Left foot with swelling and cellulitis.  Neurological:     General: No focal deficit present.     Mental Status: He is alert and oriented to person, place, and time.  Psychiatric:        Mood and Affect: Mood normal.        Behavior: Behavior normal.        ED Treatments / Results  Labs (all labs ordered are listed, but only abnormal results are  displayed) Labs Reviewed  CULTURE, BLOOD (ROUTINE X 2)  CULTURE, BLOOD (ROUTINE X 2)  COMPREHENSIVE METABOLIC PANEL  CBC WITH DIFFERENTIAL/PLATELET    EKG None  Radiology No results found.  Procedures Procedures (including critical care time)  Medications Ordered in ED Medications  vancomycin (VANCOCIN) IVPB 1000 mg/200 mL premix (0 mg Intravenous Stopped 05/04/19 1535)  ceFAZolin (ANCEF) IVPB 1 g/50 mL premix (0 g Intravenous Stopped 05/04/19 1427)     Initial Impression / Assessment and Plan / ED Course  I have reviewed the triage vital signs and the nursing notes.  Pertinent labs & imaging results that were available during my care of the patient were reviewed by me and considered in my medical decision making (see chart for details).     Pt given vanc and ancef in ED.  He does not have a DVT on [place].  Pt told to continue doxy as he's not been on it long.  Keep foot elevated.  Return if worse.  Final Clinical Impressions(s) / ED Diagnoses   Final diagnoses:  Cellulitis of left lower extremity    ED Discharge Orders         Ordered    HYDROcodone-acetaminophen (NORCO/VICODIN) 5-325 MG tablet  Every 4 hours PRN     05/04/19 1540           Jacalyn Lefevre, MD 05/04/19 1541

## 2019-05-04 NOTE — ED Notes (Signed)
ED Provider at bedside. 

## 2019-05-04 NOTE — ED Triage Notes (Signed)
Pt reports was seen two days ago at his PCP for left foot swelling. Reports had xrays that didn't show a break. Today noticed redness that traveling up left leg to knee. Pt is on the antibiotics that was started on May 14.

## 2019-05-09 LAB — CULTURE, BLOOD (ROUTINE X 2)
Culture: NO GROWTH
Culture: NO GROWTH
Special Requests: ADEQUATE

## 2019-12-06 ENCOUNTER — Encounter: Payer: Self-pay | Admitting: Podiatry

## 2019-12-06 ENCOUNTER — Ambulatory Visit (INDEPENDENT_AMBULATORY_CARE_PROVIDER_SITE_OTHER): Payer: Managed Care, Other (non HMO) | Admitting: Podiatry

## 2019-12-06 ENCOUNTER — Other Ambulatory Visit: Payer: Self-pay

## 2019-12-06 ENCOUNTER — Ambulatory Visit (INDEPENDENT_AMBULATORY_CARE_PROVIDER_SITE_OTHER): Payer: Managed Care, Other (non HMO)

## 2019-12-06 DIAGNOSIS — M7750 Other enthesopathy of unspecified foot: Secondary | ICD-10-CM | POA: Diagnosis not present

## 2019-12-06 DIAGNOSIS — M79671 Pain in right foot: Secondary | ICD-10-CM | POA: Diagnosis not present

## 2019-12-06 DIAGNOSIS — M779 Enthesopathy, unspecified: Secondary | ICD-10-CM

## 2019-12-06 DIAGNOSIS — M109 Gout, unspecified: Secondary | ICD-10-CM | POA: Diagnosis not present

## 2019-12-06 MED ORDER — METHYLPREDNISOLONE 4 MG PO TBPK: ORAL_TABLET | ORAL | 0 refills | Status: DC

## 2019-12-06 NOTE — Patient Instructions (Signed)
Gout  Gout is painful swelling of your joints. Gout is a type of arthritis. It is caused by having too much uric acid in your body. Uric acid is a chemical that is made when your body breaks down substances called purines. If your body has too much uric acid, sharp crystals can form and build up in your joints. This causes pain and swelling. Gout attacks can happen quickly and be very painful (acute gout). Over time, the attacks can affect more joints and happen more often (chronic gout). What are the causes?  Too much uric acid in your blood. This can happen because: ? Your kidneys do not remove enough uric acid from your blood. ? Your body makes too much uric acid. ? You eat too many foods that are high in purines. These foods include organ meats, some seafood, and beer.  Trauma or stress. What increases the risk?  Having a family history of gout.  Being male and middle-aged.  Being male and having gone through menopause.  Being very overweight (obese).  Drinking alcohol, especially beer.  Not having enough water in the body (being dehydrated).  Losing weight too quickly.  Having an organ transplant.  Having lead poisoning.  Taking certain medicines.  Having kidney disease.  Having a skin condition called psoriasis. What are the signs or symptoms? An attack of acute gout usually happens in just one joint. The most common place is the big toe. Attacks often start at night. Other joints that may be affected include joints of the feet, ankle, knee, fingers, wrist, or elbow. Symptoms of an attack may include:  Very bad pain.  Warmth.  Swelling.  Stiffness.  Shiny, red, or purple skin.  Tenderness. The affected joint may be very painful to touch.  Chills and fever. Chronic gout may cause symptoms more often. More joints may be involved. You may also have white or yellow lumps (tophi) on your hands or feet or in other areas near your joints. How is this  treated?  Treatment for this condition has two phases: treating an acute attack and preventing future attacks.  Acute gout treatment may include: ? NSAIDs. ? Steroids. These are taken by mouth or injected into a joint. ? Colchicine. This medicine relieves pain and swelling. It can be given by mouth or through an IV tube.  Preventive treatment may include: ? Taking small doses of NSAIDs or colchicine daily. ? Using a medicine that reduces uric acid levels in your blood. ? Making changes to your diet. You may need to see a food expert (dietitian) about what to eat and drink to prevent gout. Follow these instructions at home: During a gout attack   If told, put ice on the painful area: ? Put ice in a plastic bag. ? Place a towel between your skin and the bag. ? Leave the ice on for 20 minutes, 2-3 times a day.  Raise (elevate) the painful joint above the level of your heart as often as you can.  Rest the joint as much as possible. If the joint is in your leg, you may be given crutches.  Follow instructions from your doctor about what you cannot eat or drink. Avoiding future gout attacks  Eat a low-purine diet. Avoid foods and drinks such as: ? Liver. ? Kidney. ? Anchovies. ? Asparagus. ? Herring. ? Mushrooms. ? Mussels. ? Beer.  Stay at a healthy weight. If you want to lose weight, talk with your doctor. Do not lose weight   too fast.  Start or continue an exercise plan as told by your doctor. Eating and drinking  Drink enough fluids to keep your pee (urine) pale yellow.  If you drink alcohol: ? Limit how much you use to:  0-1 drink a day for women.  0-2 drinks a day for men. ? Be aware of how much alcohol is in your drink. In the [place] one drink equals one 12 oz bottle of beer (355 mL), one 5 oz glass of wine (148 mL), or one 1 oz glass of hard liquor (44 mL). General instructions  Take over-the-counter and prescription medicines only as told by your doctor.  Do  not drive or use heavy machinery while taking prescription pain medicine.  Return to your normal activities as told by your doctor. Ask your doctor what activities are safe for you.  Keep all follow-up visits as told by your doctor. This is important. Contact a doctor if:  You have another gout attack.  You still have symptoms of a gout attack after 10 days of treatment.  You have problems (side effects) because of your medicines.  You have chills or a fever.  You have burning pain when you pee (urinate).  You have pain in your lower back or belly. Get help right away if:  You have very bad pain.  Your pain cannot be controlled.  You cannot pee. Summary  Gout is painful swelling of the joints.  The most common site of pain is the big toe, but it can affect other joints.  Medicines and avoiding some foods can help to prevent and treat gout attacks. This information is not intended to replace advice given to you by your health care provider. Make sure you discuss any questions you have with your health care provider. Document Released: 09/13/2008 Document Revised: 06/27/2018 Document Reviewed: 06/27/2018 Elsevier Patient Education  2020 Elsevier Inc.  

## 2019-12-07 LAB — URIC ACID: Uric Acid, Serum: 7.9 mg/dL (ref 4.0–8.0)

## 2019-12-11 ENCOUNTER — Other Ambulatory Visit: Payer: Self-pay | Admitting: Podiatry

## 2019-12-11 DIAGNOSIS — M109 Gout, unspecified: Secondary | ICD-10-CM

## 2019-12-19 ENCOUNTER — Other Ambulatory Visit: Payer: Self-pay

## 2019-12-19 ENCOUNTER — Ambulatory Visit (INDEPENDENT_AMBULATORY_CARE_PROVIDER_SITE_OTHER): Payer: Managed Care, Other (non HMO) | Admitting: Podiatry

## 2019-12-19 DIAGNOSIS — M779 Enthesopathy, unspecified: Secondary | ICD-10-CM

## 2019-12-19 DIAGNOSIS — M109 Gout, unspecified: Secondary | ICD-10-CM

## 2019-12-19 MED ORDER — COLCHICINE 0.6 MG PO TABS: 0.6000 mg | ORAL_TABLET | Freq: Every day | ORAL | 2 refills | Status: DC

## 2019-12-19 NOTE — Progress Notes (Signed)
  Subjective:  Patient ID: Alexander Perez, male    DOB: [birth year],  MRN: [id number]  Chief Complaint  Patient presents with  . Gout    pt declines any new changes.   . Labs Only    33 y.o. male presents with the above complaint. Hx confirmed with patient. States the steroid pack helped a lot with the pain after a day or two.  Objective:  Physical Exam: POP right cuboid area, no further warmth. Slight edema..  Radiographs:  None Assessment:  1. Capsulitis   2. Gout of right foot, unspecified cause, unspecified chronicity     Plan:  Patient was evaluated and treated and all questions answered.  Gout -Labs reviewed, high end of normal -Rx colchicine for as needed  Capsulitis 4th/5th TMT -Injection as below  Procedure: Joint Injection Location: Right 4th/5th joint Skin Prep: Alcohol. Injectate: 0.5 cc 1% lidocaine plain, 0.5 cc dexamethasone phosphate. Disposition: Patient tolerated procedure well. Injection site dressed with a band-aid.  Return if symptoms worsen or fail to improve.

## 2019-12-22 NOTE — Progress Notes (Signed)
   Subjective:    Patient ID: Alexander Perez, male    DOB: 03-Jan-1986, 34 y.o.   MRN: [id number]   Chief Complaint  Patient presents with  . Foot Problem    the right foot is hurting and has some redness to it as well     The patient presents with chronic history of pain to the outside of the right foot.  Denies injury, denies history of insect bite.  States that this happens two or three times a year, always occurs in the same location.  Reports pain on the top of the foot and swelling with redness.  He states that he has difficulty lifting the foot and pulling forward due to pain.  He has gone to the ER for this issue before and has been given antibiotics which failed to alleviate the problem.  States that it has gotten better on its own before, now it is back.  Reports soreness and tenderness.  States he eats red meat and shellfish.  Objective:   Constitutional Well developed. Well nourished.  Vascular Dorsalis pedis pulses palpable bilaterally. Posterior tibial pulses palpable bilaterally. Capillary refill normal to all digits. No cyanosis or clubbing noted. Pedal hair growth normal.  Neurologic Normal speech. Oriented to person, place, and time. Epicritic sensation to light touch grossly present bilaterally.  Dermatologic Warmth and erythema about the lateral aspect of the right foot.   Orthopedic: Pain to palpation about the right calcaneocuboid joint with local redness and edema with induration.   Radiographs:  Taken and reviewed, no acute fractures or dislocations.  Cavus foot type.  Assessment/Plan:  Patient was evaluated and treated and all questions answered.  1.  Right foot pain consistent with gouty process. - X-rays reviewed with the patient. - Discussed given this chronic recurrence unlikely to be infectious etiology, especially given that he has failed antibiotic treatment. - We will trial oral steroids and check uric acid. - Should pain persist, we would consider  injection locally at the calcaneocuboid joint. - The patient will follow up in two weeks for recheck.

## 2020-02-12 IMAGING — DX LEFT FOOT - COMPLETE 3+ VIEW
3 series · 3 of 3 positions shown · non-contrast
Comparison: None.

CLINICAL DATA: Left foot pain and swelling for several days,
initial encounter

EXAM:
LEFT FOOT - COMPLETE 3+ VIEW

[foot ap]
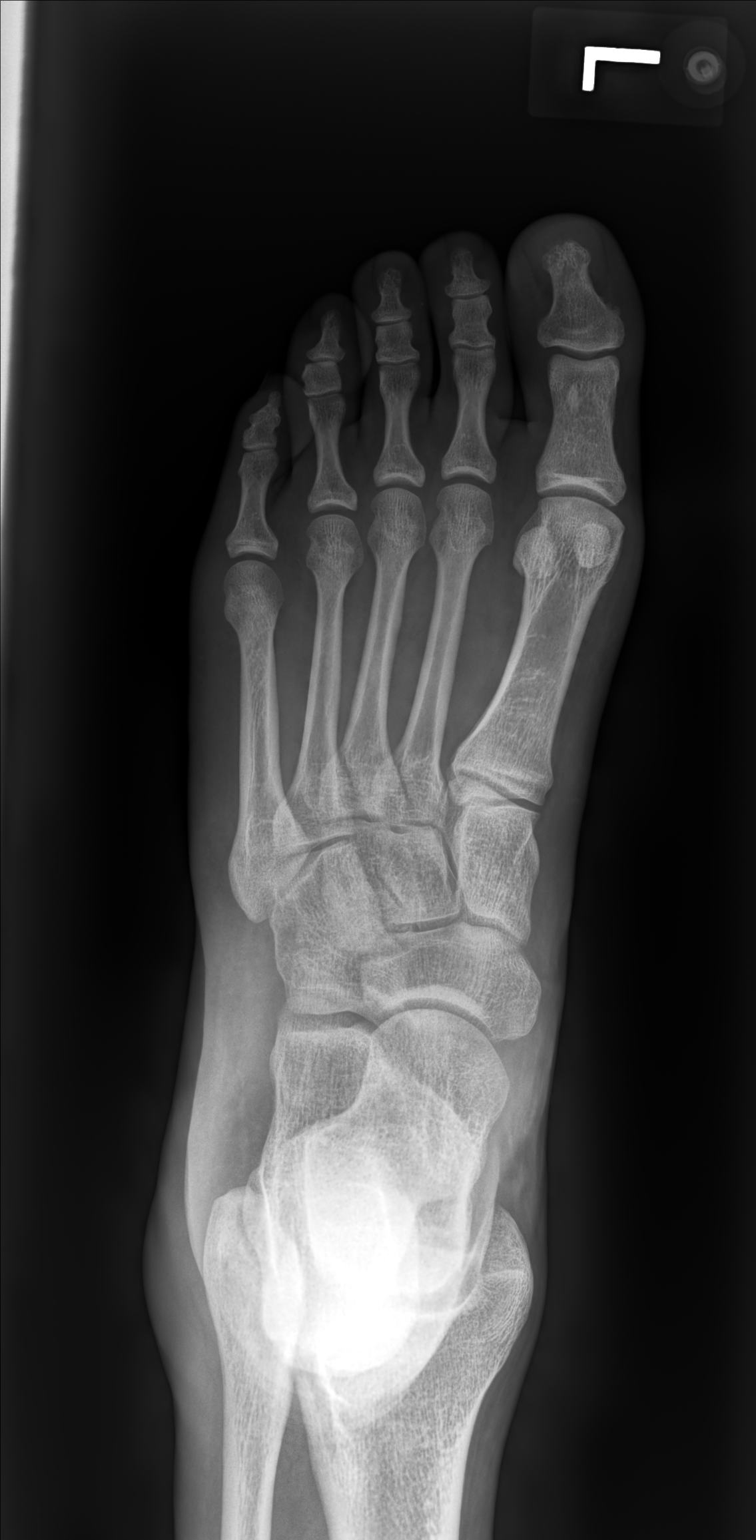

[foot mlo]
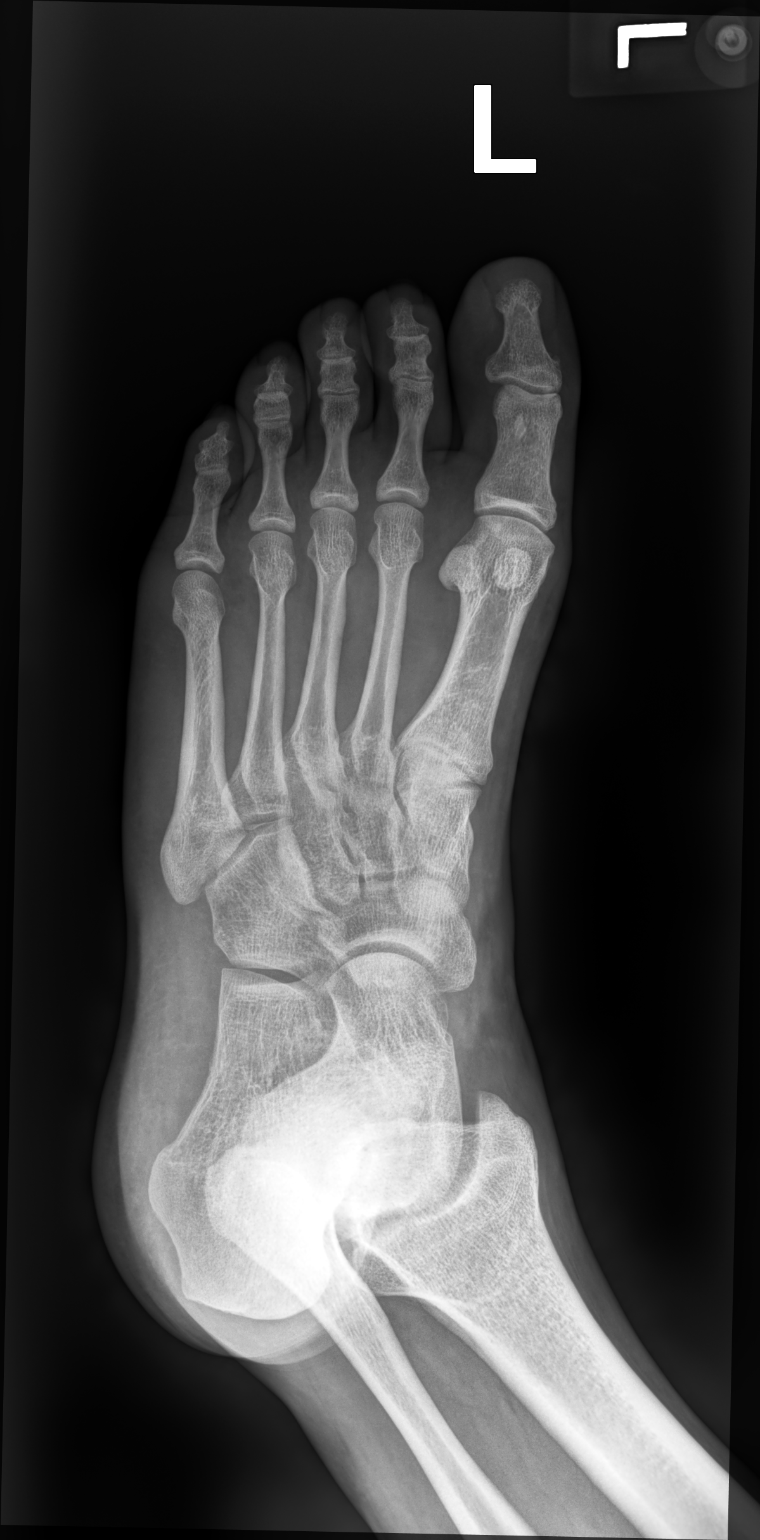

[foot lat]
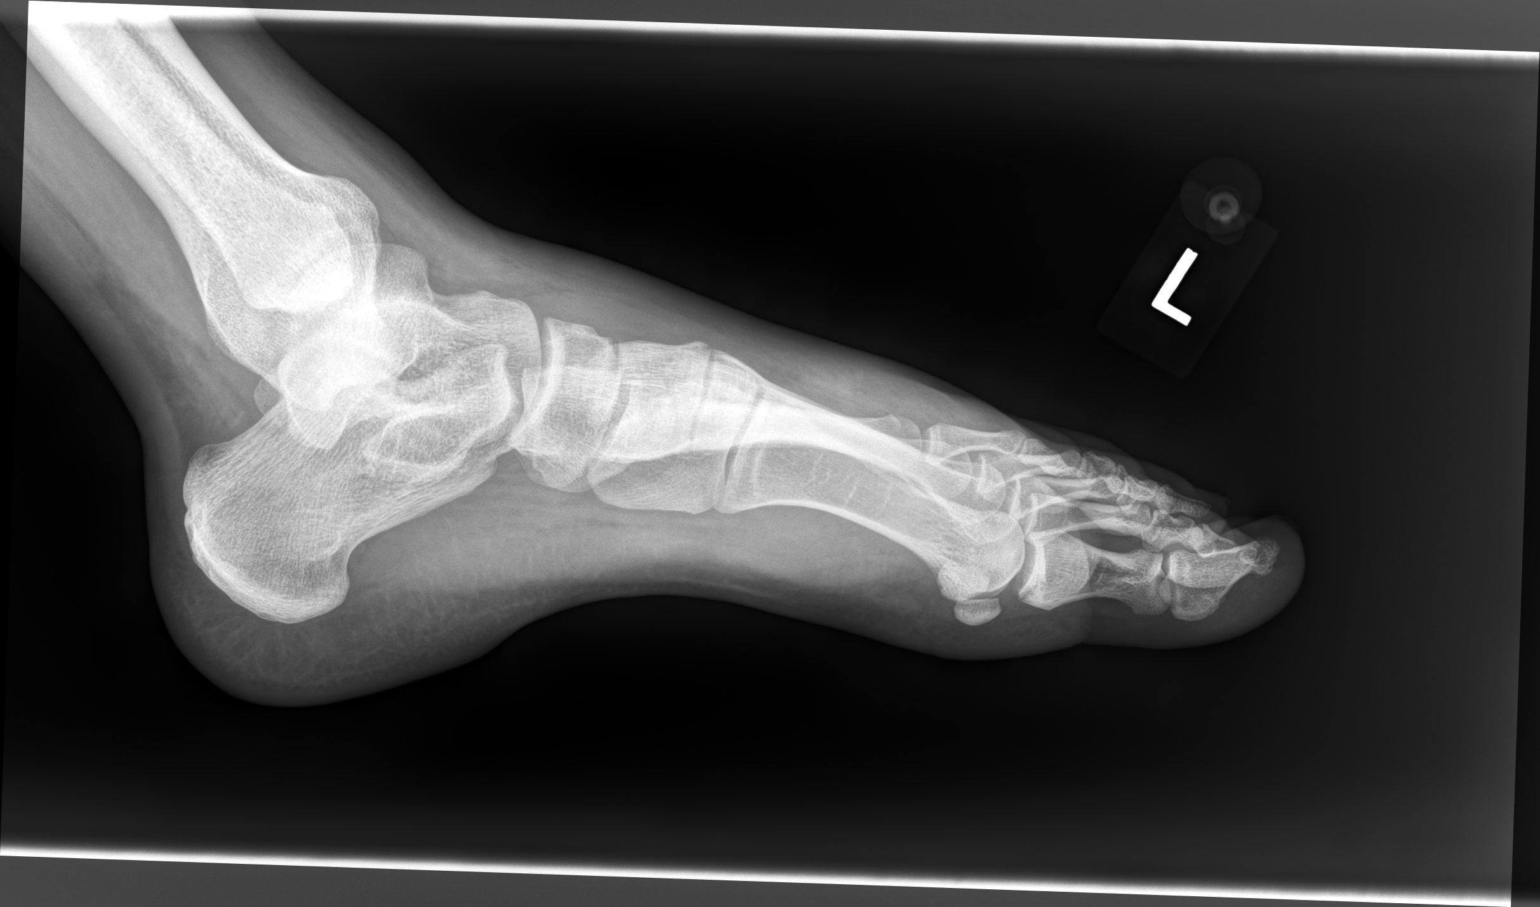

[3 of 3 positions shown; findings below may reference images not displayed]

FINDINGS: No acute fracture or dislocation is noted. Some soft tissue swelling
is noted along the dorsal aspect of the foot in the region of the
heads of the metatarsals. No radiopaque foreign body is seen.
IMPRESSION: Mild soft tissue swelling without acute bony abnormality.

## 2020-12-22 DIAGNOSIS — L918 Other hypertrophic disorders of the skin: Secondary | ICD-10-CM | POA: Diagnosis not present

## 2021-01-27 DIAGNOSIS — E7800 Pure hypercholesterolemia, unspecified: Secondary | ICD-10-CM | POA: Diagnosis not present

## 2021-06-14 DIAGNOSIS — M545 Low back pain, unspecified: Secondary | ICD-10-CM | POA: Diagnosis not present

## 2021-06-28 DIAGNOSIS — M47816 Spondylosis without myelopathy or radiculopathy, lumbar region: Secondary | ICD-10-CM | POA: Diagnosis not present

## 2021-07-02 ENCOUNTER — Telehealth: Payer: Self-pay | Admitting: Podiatry

## 2021-07-02 MED ORDER — COLCHICINE 0.6 MG PO TABS: 0.6000 mg | ORAL_TABLET | Freq: Every day | ORAL | 0 refills | Status: DC | PRN

## 2021-07-02 NOTE — Addendum Note (Signed)
Addended by: Ventura Sellers on: 07/02/2021 12:12 PM   Modules accepted: Orders

## 2021-07-02 NOTE — Telephone Encounter (Signed)
Patient is calling to request a refill on medication for gout. Please advise if patient will need to come in for evaluation prior to prescription refill.

## 2021-08-10 ENCOUNTER — Other Ambulatory Visit: Payer: Self-pay

## 2021-08-10 ENCOUNTER — Ambulatory Visit (INDEPENDENT_AMBULATORY_CARE_PROVIDER_SITE_OTHER): Payer: Self-pay | Admitting: Podiatry

## 2021-08-10 DIAGNOSIS — M779 Enthesopathy, unspecified: Secondary | ICD-10-CM

## 2021-08-10 DIAGNOSIS — M7750 Other enthesopathy of unspecified foot: Secondary | ICD-10-CM

## 2021-08-10 MED ORDER — BETAMETHASONE SOD PHOS & ACET 6 (3-3) MG/ML IJ SUSP
6.0000 mg | Freq: Once | INTRAMUSCULAR | Status: AC
  Administered 2021-08-10: 6 mg

## 2021-08-10 NOTE — Progress Notes (Signed)
  Subjective:  Patient ID: Alexander Perez, male    DOB: 07/06/1986,  MRN: [id number]  Chief Complaint  Patient presents with   Gout    Left foot gout    35 y.o. male presents with the above complaint. Hx confirmed with patient. Has flared up for the past 8 or 9 days. Has been taking colchicine and thinks it helped a little but is still having a lot of pain. Localized to left forefoot. Also has pain at right lateral foot, injection previously helped but it has recurred.  Objective:  Physical Exam: POP right 4th/5th TMT with prominent edema area, no further warmth. Left 3rd MPJ POP, local warmth and erythema. No fluctuance, but with local edema  Radiographs:  None Assessment:   1. Capsulitis   2. Capsulitis of metatarsophalangeal (MTP) joint    Plan:  Patient was evaluated and treated and all questions answered.  Gout -Continue colchicine -Injection as below  Procedure: Joint Injection Location: Left 3rd MPJ joint Skin Prep: Alcohol. Injectate: 0.5 cc 1% lidocaine plain, 0.5 cc betamethasone acetate-betamethasone sodium phosphate Disposition: Patient tolerated procedure well. Injection site dressed with a band-aid.  Capsulitis 4th/5th TMT -Repeat injection as below  Procedure: Joint Injection Location: Right 4th/5th TMT joint Skin Prep: Alcohol. Injectate: 0.5 cc 1% lidocaine plain, 0.5 cc betamethasone acetate-betamethasone sodium phosphate Disposition: Patient tolerated procedure well. Injection site dressed with a band-aid.  Ankle Issues while running -XR next visit bilat ankles  Return in about 3 weeks (around 08/31/2021) for Gout flare-up.

## 2021-08-31 ENCOUNTER — Other Ambulatory Visit: Payer: Self-pay

## 2021-08-31 ENCOUNTER — Ambulatory Visit (INDEPENDENT_AMBULATORY_CARE_PROVIDER_SITE_OTHER): Payer: BC Managed Care – PPO

## 2021-08-31 ENCOUNTER — Ambulatory Visit (INDEPENDENT_AMBULATORY_CARE_PROVIDER_SITE_OTHER): Payer: BC Managed Care – PPO | Admitting: Podiatry

## 2021-08-31 DIAGNOSIS — M109 Gout, unspecified: Secondary | ICD-10-CM

## 2021-08-31 DIAGNOSIS — M7751 Other enthesopathy of right foot: Secondary | ICD-10-CM

## 2021-08-31 DIAGNOSIS — M7750 Other enthesopathy of unspecified foot: Secondary | ICD-10-CM

## 2021-08-31 DIAGNOSIS — M7752 Other enthesopathy of left foot: Secondary | ICD-10-CM

## 2021-08-31 DIAGNOSIS — M779 Enthesopathy, unspecified: Secondary | ICD-10-CM | POA: Diagnosis not present

## 2021-08-31 MED ORDER — METHYLPREDNISOLONE 4 MG PO TBPK: ORAL_TABLET | ORAL | 0 refills | Status: AC

## 2021-08-31 MED ORDER — COLCHICINE 0.6 MG PO TABS: 0.6000 mg | ORAL_TABLET | Freq: Every day | ORAL | 1 refills | Status: DC | PRN

## 2021-09-06 NOTE — Progress Notes (Signed)
  Subjective:  Patient ID: Alexander Perez, male    DOB: 1986-04-07,  MRN: [id number]  Chief Complaint  Patient presents with   Gout     gout flare up   35 y.o. male presents with the above complaint. Hx confirmed with patient.  States that the gout is still flared up.  This is a second flareup and less than 2 months.  He is taking the colchicine but request refill today.  Objective:  Physical Exam: POP right 4th/5th TMT with prominent edema area, no further warmth. Left 3rd MPJ POP, local warmth and erythema. No fluctuance, but with local edema Assessment:   1. Capsulitis of metatarsophalangeal (MTP) joint   2. Capsulitis   3. Gout of right foot, unspecified cause, unspecified chronicity    Plan:  Patient was evaluated and treated and all questions answered.  Gout -No injection today refill colchicine.  I do think he would benefit from checking a uric acid for possible long-term therapy.  This was done today.  Start Medrol pack  Ankle Issues while running -X-rays taken and reviewed no acute fractures dislocations. -Likely overuse of discussed gentle progression of running exercises to prevent overload strain  No follow-ups on file.

## 2021-09-22 ENCOUNTER — Ambulatory Visit (INDEPENDENT_AMBULATORY_CARE_PROVIDER_SITE_OTHER): Payer: BC Managed Care – PPO | Admitting: Podiatry

## 2021-09-22 ENCOUNTER — Other Ambulatory Visit: Payer: Self-pay

## 2021-09-22 DIAGNOSIS — M7750 Other enthesopathy of unspecified foot: Secondary | ICD-10-CM

## 2021-09-22 DIAGNOSIS — M7989 Other specified soft tissue disorders: Secondary | ICD-10-CM

## 2021-09-24 ENCOUNTER — Encounter: Payer: Self-pay | Admitting: Podiatry

## 2021-09-24 NOTE — Progress Notes (Signed)
Subjective:  Patient ID: Alexander [facility], male    DOB: 1986/01/05,  MRN: [id number]  Chief Complaint  Patient presents with   Gout    Pt states that he has issues with gout of his left foot.pt states that he has pain when walking. Pt states he has had injections, gout medication,and a dosepak but is still having issues with pain. Further evaluation.      35 y.o. male presents with the above complaint.  Patient presents with complaint of left second metatarsophalangeal joint pain.  Patient states is still hurts when ambulating especially when walking.  Patient states that there may be a little mass forming around the second and third metatarsophalangeal joint.  He states it hurts with ambulation has progressed to gotten worse since last Wednesday.  He has had injections in the past which seems to help however only temporarily.  Patient was treated in the past by Dr. Samuella Cota for gout.  Colchicine does not help another dose but does help some.  He wanted to get it this evaluated further.  He denies any other acute complaints.   Review of Systems: Negative except as noted in the HPI. Denies N/V/F/Ch.  Past Medical History:  Diagnosis Date   Positive PPD    cxr - normal , no tx required    Current Outpatient Medications:    colchicine 0.6 MG tablet, Take 1 tablet (0.6 mg total) by mouth daily as needed (gout attack)., Disp: 30 tablet, Rfl: 1   ibuprofen (ADVIL) 200 MG tablet, Take 200 mg by mouth every 6 (six) hours as needed for mild pain., Disp: , Rfl:    methylPREDNISolone (MEDROL DOSEPAK) 4 MG TBPK tablet, 6 Day Taper Pack. Take as Directed., Disp: 21 tablet, Rfl: 0  Social History   Tobacco Use  Smoking Status Former   Packs/day: 0.30   Types: Cigarettes   Start date: 12/19/2016  Smokeless Tobacco Never    No Known Allergies Objective:  There were no vitals filed for this visit. There is no height or weight on file to calculate BMI. Constitutional Well developed. Well nourished.   Vascular Dorsalis pedis pulses palpable bilaterally. Posterior tibial pulses palpable bilaterally. Capillary refill normal to all digits.  No cyanosis or clubbing noted. Pedal hair growth normal.  Neurologic Normal speech. Oriented to person, place, and time. Epicritic sensation to light touch grossly present bilaterally.  Dermatologic Nails well groomed and normal in appearance. No open wounds. No skin lesions.  Orthopedic: Pain on palpation with range of motion of the second metatarsophalangeal joint's left.  Palpable mass noted dorsally hard/indurated nodule single lobulated.  Mildly able to appreciate through the interspace on the plantar aspect.   Radiographs: None Assessment:   1. Soft tissue mass   2. Capsulitis of metatarsophalangeal (MTP) joint    Plan:  Patient was evaluated and treated and all questions answered.  Left second metatarsophalangeal joint capsulitis versus soft tissue mass -I explained to the patient the etiology of capsulitis and various treatment options were discussed.  There is an inflammatory component to this as well.  I believe patient will benefit from steroid injection help decrease acute inflammatory component associate with pain.  Patient agrees with plan like to proceed with a steroid injection.  This was injected from plantar side therefore not interfering with the soft tissue mass -A steroid injection was performed at left second metatarsophalangeal joint using 1% plain Lidocaine and 10 mg of Kenalog. This was well tolerated.  Left dorsal second metatarsal and third  metatarsal soft tissue mass -Given the nature of the mass and the foot progression of it over time I believe patient will benefit from an MRI evaluation to assess the depth in the great integrity of the mass.  This could also be a neuroma as patient does have some clinical signs of a neuroma.  However I discussed with the patient all the possibility.  At this time we will await MRI -MRI  was ordered   No follow-ups on file.

## 2021-10-05 ENCOUNTER — Other Ambulatory Visit: Payer: Self-pay | Admitting: Podiatry

## 2021-10-05 ENCOUNTER — Telehealth: Payer: Self-pay | Admitting: Podiatry

## 2021-10-05 DIAGNOSIS — M7989 Other specified soft tissue disorders: Secondary | ICD-10-CM

## 2021-10-05 NOTE — Telephone Encounter (Signed)
Patient called the office stating [facility] called him stating the order for his MRI was put in incorrectly instead of his left foot the order was suppose to be his right foot , he states [facility] told him to give [place] a call back so another order could be put In the system .    Please advise .Marland Kitchen

## 2021-10-05 NOTE — Telephone Encounter (Signed)
I called the patient and left a voicemail letting him know the order has been changed

## 2021-10-06 DIAGNOSIS — M7750 Other enthesopathy of unspecified foot: Secondary | ICD-10-CM | POA: Diagnosis not present

## 2021-10-07 LAB — URIC ACID: Uric Acid, Serum: 9.4 mg/dL — ABNORMAL HIGH (ref 4.0–8.0)

## 2021-10-13 ENCOUNTER — Ambulatory Visit (INDEPENDENT_AMBULATORY_CARE_PROVIDER_SITE_OTHER): Payer: BC Managed Care – PPO | Admitting: Podiatry

## 2021-10-13 ENCOUNTER — Encounter: Payer: Self-pay | Admitting: Podiatry

## 2021-10-13 ENCOUNTER — Other Ambulatory Visit: Payer: Self-pay

## 2021-10-13 DIAGNOSIS — M7989 Other specified soft tissue disorders: Secondary | ICD-10-CM | POA: Diagnosis not present

## 2021-10-13 DIAGNOSIS — M7750 Other enthesopathy of unspecified foot: Secondary | ICD-10-CM

## 2021-10-13 DIAGNOSIS — Z23 Encounter for immunization: Secondary | ICD-10-CM | POA: Diagnosis not present

## 2021-10-13 MED ORDER — ALLOPURINOL 100 MG PO TABS: 100.0000 mg | ORAL_TABLET | Freq: Every day | ORAL | 1 refills | Status: AC

## 2021-10-13 MED ORDER — ALLOPURINOL 100 MG PO TABS: 100.0000 mg | ORAL_TABLET | Freq: Every day | ORAL | 6 refills | Status: DC

## 2021-10-13 NOTE — Progress Notes (Addendum)
  Subjective:  Patient ID: Alexander Perez, male    DOB: 27-Apr-1986,  MRN: [id number]  Chief Complaint  Patient presents with   soft tissue mass     MRI follow up     35 y.o. male presents with the above complaint.  Patient presents for follow-up to left second metatarsophalangeal joint capsulitis as well as soft tissue mass.  He states he is doing a lot better.  The mass has gone away.  He denies any other acute complaints.  The pain is also completely resolved.   Review of Systems: Negative except as noted in the HPI. Denies N/V/F/Ch.  Past Medical History:  Diagnosis Date   Positive PPD    cxr - normal , no tx required    Current Outpatient Medications:    allopurinol (ZYLOPRIM) 100 MG tablet, Take 1 tablet (100 mg total) by mouth daily., Disp: 30 tablet, Rfl: 1   colchicine 0.6 MG tablet, Take 1 tablet (0.6 mg total) by mouth daily as needed (gout attack)., Disp: 30 tablet, Rfl: 1   ibuprofen (ADVIL) 200 MG tablet, Take 200 mg by mouth every 6 (six) hours as needed for mild pain., Disp: , Rfl:    methylPREDNISolone (MEDROL DOSEPAK) 4 MG TBPK tablet, 6 Day Taper Pack. Take as Directed., Disp: 21 tablet, Rfl: 0  Social History   Tobacco Use  Smoking Status Former   Packs/day: 0.30   Types: Cigarettes   Start date: 12/19/2016  Smokeless Tobacco Never    No Known Allergies Objective:  There were no vitals filed for this visit. There is no height or weight on file to calculate BMI. Constitutional Well developed. Well nourished.  Vascular Dorsalis pedis pulses palpable bilaterally. Posterior tibial pulses palpable bilaterally. Capillary refill normal to all digits.  No cyanosis or clubbing noted. Pedal hair growth normal.  Neurologic Normal speech. Oriented to person, place, and time. Epicritic sensation to light touch grossly present bilaterally.  Dermatologic Nails well groomed and normal in appearance. No open wounds. No skin lesions.  Orthopedic: No further pain on  palpation with range of motion of the second metatarsophalangeal joint's left.  No further palpable mass noted dorsally hard/indurated nodule single lobulated.  Not able to appreciate it through the interspace.   Radiographs: None Assessment:   1. Soft tissue mass   2. Capsulitis of metatarsophalangeal (MTP) joint     Plan:  Patient was evaluated and treated and all questions answered.  Left second metatarsophalangeal joint capsulitis versus soft tissue mass -Clinically resolved with a steroid injection.  At this time I discussed patient in extensive detail for shoe gear modification as well as continued use of orthotics.  Patient states understand will do so.  If the foot and ankle issues arise again I have asked him to come see me.  He states understanding -Patient's uric acid was 9.4 therefore I believe the capsulitis may have been due to gouty attack.  I encouraged diet modification as well as pal allopurinol.  30-day supply with 1 refill was dispensed to the pharmacy.  I have asked the patient to follow-up with a primary care physician for further management.  Left dorsal second metatarsal and third metatarsal soft tissue mass -Clinically self resolved at this time we will hold off on the MRI.  Patient will cancel the MRI   No follow-ups on file.

## 2021-10-14 ENCOUNTER — Other Ambulatory Visit: Payer: Self-pay

## 2021-11-18 DIAGNOSIS — Z1329 Encounter for screening for other suspected endocrine disorder: Secondary | ICD-10-CM | POA: Diagnosis not present

## 2021-11-18 DIAGNOSIS — Z0000 Encounter for general adult medical examination without abnormal findings: Secondary | ICD-10-CM | POA: Diagnosis not present

## 2021-11-18 DIAGNOSIS — M545 Low back pain, unspecified: Secondary | ICD-10-CM | POA: Diagnosis not present

## 2021-11-18 DIAGNOSIS — I10 Essential (primary) hypertension: Secondary | ICD-10-CM | POA: Diagnosis not present

## 2021-11-18 DIAGNOSIS — I6523 Occlusion and stenosis of bilateral carotid arteries: Secondary | ICD-10-CM | POA: Diagnosis not present

## 2021-11-18 DIAGNOSIS — Z23 Encounter for immunization: Secondary | ICD-10-CM | POA: Diagnosis not present

## 2021-11-18 DIAGNOSIS — E781 Pure hyperglyceridemia: Secondary | ICD-10-CM | POA: Diagnosis not present

## 2021-11-19 ENCOUNTER — Other Ambulatory Visit: Payer: Self-pay | Admitting: Family Medicine

## 2021-11-19 DIAGNOSIS — I6523 Occlusion and stenosis of bilateral carotid arteries: Secondary | ICD-10-CM

## 2021-12-06 DIAGNOSIS — M5459 Other low back pain: Secondary | ICD-10-CM | POA: Diagnosis not present

## 2021-12-07 ENCOUNTER — Ambulatory Visit
Admission: RE | Admit: 2021-12-07 | Discharge: 2021-12-07 | Disposition: A | Discharge status: Home / Self Care | Payer: BC Managed Care – PPO | Source: Ambulatory Visit | Attending: Family Medicine | Admitting: Family Medicine

## 2021-12-07 DIAGNOSIS — I6523 Occlusion and stenosis of bilateral carotid arteries: Secondary | ICD-10-CM

## 2021-12-17 DIAGNOSIS — M5459 Other low back pain: Secondary | ICD-10-CM | POA: Diagnosis not present

## 2021-12-24 DIAGNOSIS — M5459 Other low back pain: Secondary | ICD-10-CM | POA: Diagnosis not present

## 2022-02-17 DIAGNOSIS — I10 Essential (primary) hypertension: Secondary | ICD-10-CM | POA: Diagnosis not present

## 2022-04-18 ENCOUNTER — Ambulatory Visit (INDEPENDENT_AMBULATORY_CARE_PROVIDER_SITE_OTHER): Payer: BC Managed Care – PPO | Admitting: Podiatry

## 2022-04-18 ENCOUNTER — Telehealth: Payer: Self-pay | Admitting: *Deleted

## 2022-04-18 DIAGNOSIS — M10072 Idiopathic gout, left ankle and foot: Secondary | ICD-10-CM

## 2022-04-18 MED ORDER — COLCHICINE 0.6 MG PO TABS: 0.6000 mg | ORAL_TABLET | Freq: Every day | ORAL | 1 refills | Status: DC | PRN

## 2022-04-18 NOTE — Progress Notes (Signed)
? ?  HPI: 36 y.o. male presenting today for an acute gout flareup.  Patient has a longstanding history of gout for the last 3-4 years.  Patient states that about 2 weeks ago he had an acute gout flareup.  He currently takes allopurinol 100 mg daily.  He is requesting a refill of colchicine.  He presents for further treatment and evaluation ? ?Past Medical History:  ?Diagnosis Date  ? Positive PPD   ? cxr - normal , no tx required  ? ? ?No past surgical history on file. ? ?No Known Allergies ?  ?Physical Exam: ?General: The patient is alert and oriented x3 in no acute distress. ? ?Dermatology: Skin is warm, dry and supple bilateral lower extremities. Negative for open lesions or macerations. ? ?Vascular: Palpable pedal pulses bilaterally. Capillary refill within normal limits.  Heavy erythema with edema noted to the lateral aspect of the midtarsal joint left foot.  Associated tenderness to palpation as well ? ?Neurological: Light touch and protective threshold grossly intact ? ?Musculoskeletal Exam: No pedal deformities noted ? ?Assessment: ?1.  Acute gout left midtarsal joint ? ? ?Plan of Care:  ?1. Patient evaluated. ?2.  Injection of 0.5 cc Celestone Soluspan injected to the lateral aspect of the left midtarsal joint ?3.  Refill prescription for colchicine 0.6 mg daily ?4.  Continue allopurinol 100 mg daily ?5.  Return to clinic as needed ?  ?  ?Felecia Shelling, DPM ?[facility] ? ?Dr. Felecia Shelling, DPM  ?  ?[address]                                        ?[place], [place] [address]                ?Office [phone]  ?Fax [phone] ? ? ? ? ?

## 2022-04-18 NOTE — Telephone Encounter (Signed)
Patient is calling he is having another possible gout flare up and it's pretty bad. Please called patient to schedule an appointment, has not been seen since 10/22. ?

## 2022-06-29 ENCOUNTER — Telehealth: Payer: Self-pay | Admitting: Podiatry

## 2022-06-29 ENCOUNTER — Other Ambulatory Visit: Payer: Self-pay | Admitting: Podiatry

## 2022-06-29 ENCOUNTER — Ambulatory Visit (INDEPENDENT_AMBULATORY_CARE_PROVIDER_SITE_OTHER): Payer: BC Managed Care – PPO | Admitting: Podiatry

## 2022-06-29 DIAGNOSIS — M10072 Idiopathic gout, left ankle and foot: Secondary | ICD-10-CM

## 2022-06-29 MED ORDER — METHYLPREDNISOLONE 4 MG PO TBPK: ORAL_TABLET | ORAL | 0 refills | Status: AC

## 2022-06-29 NOTE — Telephone Encounter (Signed)
Pt was just seen in the office and there was a rx that was to be sent in and he contacted the pharmacy and they did not have it.

## 2022-06-29 NOTE — Telephone Encounter (Signed)
Notified pt medication was sent in.  He said thank you.

## 2022-06-29 NOTE — Progress Notes (Signed)
Patient is having knee arthritis.  I have referred him to [facility].  Unfortunately this is outside of my scope of practice.  Patient understands.  This appointment will be canceled

## 2022-07-07 ENCOUNTER — Telehealth: Payer: Self-pay | Admitting: *Deleted

## 2022-07-07 DIAGNOSIS — M25561 Pain in right knee: Secondary | ICD-10-CM | POA: Diagnosis not present

## 2022-07-07 MED ORDER — METHYLPREDNISOLONE 4 MG PO TBPK: ORAL_TABLET | ORAL | 0 refills | Status: AC

## 2022-07-07 NOTE — Telephone Encounter (Signed)
Patient is requesting a refill of the medrol dosepak, , still suffering, not totally resolved. Please advise.

## 2022-08-08 ENCOUNTER — Other Ambulatory Visit: Payer: Self-pay | Admitting: Podiatry

## 2022-08-08 NOTE — Telephone Encounter (Signed)
please

## 2022-09-20 DIAGNOSIS — Z23 Encounter for immunization: Secondary | ICD-10-CM | POA: Diagnosis not present

## 2022-11-24 DIAGNOSIS — Z1329 Encounter for screening for other suspected endocrine disorder: Secondary | ICD-10-CM | POA: Diagnosis not present

## 2022-11-24 DIAGNOSIS — Z131 Encounter for screening for diabetes mellitus: Secondary | ICD-10-CM | POA: Diagnosis not present

## 2022-11-24 DIAGNOSIS — Z0000 Encounter for general adult medical examination without abnormal findings: Secondary | ICD-10-CM | POA: Diagnosis not present

## 2022-11-24 DIAGNOSIS — I10 Essential (primary) hypertension: Secondary | ICD-10-CM | POA: Diagnosis not present

## 2022-11-24 DIAGNOSIS — E781 Pure hyperglyceridemia: Secondary | ICD-10-CM | POA: Diagnosis not present

## 2023-05-10 ENCOUNTER — Ambulatory Visit (INDEPENDENT_AMBULATORY_CARE_PROVIDER_SITE_OTHER): Payer: Managed Care, Other (non HMO)

## 2023-05-10 ENCOUNTER — Ambulatory Visit (INDEPENDENT_AMBULATORY_CARE_PROVIDER_SITE_OTHER): Payer: Managed Care, Other (non HMO) | Admitting: Podiatry

## 2023-05-10 ENCOUNTER — Encounter: Payer: Self-pay | Admitting: Podiatry

## 2023-05-10 DIAGNOSIS — M109 Gout, unspecified: Secondary | ICD-10-CM

## 2023-05-10 DIAGNOSIS — M7750 Other enthesopathy of unspecified foot: Secondary | ICD-10-CM

## 2023-05-10 DIAGNOSIS — M7751 Other enthesopathy of right foot: Secondary | ICD-10-CM | POA: Diagnosis not present

## 2023-05-10 MED ORDER — TRIAMCINOLONE ACETONIDE 10 MG/ML IJ SUSP
10.0000 mg | Freq: Once | INTRAMUSCULAR | Status: AC
  Administered 2023-05-10: 10 mg

## 2023-05-10 NOTE — Patient Instructions (Signed)
Gout  Gout is painful swelling of your joints. Gout is a type of arthritis. It is caused by having too much uric acid in your body. Uric acid is a chemical that is made when your body breaks down substances called purines. If your body has too much uric acid, sharp crystals can form and build up in your joints. This causes pain and swelling. Gout attacks can happen quickly and be very painful (acute gout). Over time, the attacks can affect more joints and happen more often (chronic gout). What are the causes? Gout is caused by too much uric acid in your blood. This can happen because: Your kidneys do not remove enough uric acid from your blood. Your body makes too much uric acid. You eat too many foods that are high in purines. These foods include organ meats, some seafood, and beer. Trauma or stress can bring on an attack. What increases the risk? Having a family history of gout. Being male and middle-aged. Being male and having gone through menopause. Having an organ transplant. Taking certain medicines. Having certain conditions, such as: Being very overweight (obese). Lead poisoning. Kidney disease. A skin condition called psoriasis. Other risks include: Losing weight too quickly. Not having enough water in the body (being dehydrated). Drinking alcohol, especially beer. Drinking beverages that are sweetened with a type of sugar called fructose. What are the signs or symptoms? An attack of acute gout often starts at night and usually happens in just one joint. The most common place is the big toe. Other joints that may be affected include joints of the feet, ankle, knee, fingers, wrist, or elbow. Symptoms may include: Very bad pain. Warmth. Swelling. Stiffness. Tenderness. The affected joint may be very painful to touch. Shiny, red, or purple skin. Chills and fever. Chronic gout may cause symptoms more often. More joints may be involved. You may also have white or yellow lumps  (tophi) on your hands or feet or in other areas near your joints. How is this treated? Treatment for an acute attack may include medicines for pain and swelling, such as: NSAIDs, such as ibuprofen. Steroids taken by mouth or injected into a joint. Colchicine. This can be given by mouth or through an IV tube. Treatment to prevent future attacks may include: Taking small doses of NSAIDs or colchicine daily. Using a medicine that reduces uric acid levels in your blood, such as allopurinol. Making changes to your diet. You may need to see a food expert (dietitian) about what to eat and drink to prevent gout. Follow these instructions at home: During a gout attack  If told, put ice on the painful area. To do this: Put ice in a plastic bag. Place a towel between your skin and the bag. Leave the ice on for 20 minutes, 2-3 times a day. Take off the ice if your skin turns bright red. This is very important. If you cannot feel pain, heat, or cold, you have a greater risk of damage to the area. Raise the painful joint above the level of your heart as often as you can. Rest the joint as much as possible. If the joint is in your leg, you may be given crutches. Follow instructions from your doctor about what you cannot eat or drink. Avoiding future gout attacks Eat a low-purine diet. Avoid foods and drinks such as: Liver. Kidney. Anchovies. Asparagus. Herring. Mushrooms. Mussels. Beer. Stay at a healthy weight. If you want to lose weight, talk with your doctor. Do not   lose weight too fast. Start or continue an exercise plan as told by your doctor. Eating and drinking Avoid drinks sweetened by fructose. Drink enough fluids to keep your pee (urine) pale yellow. If you drink alcohol: Limit how much you have to: 0-1 drink a day for women who are not pregnant. 0-2 drinks a day for men. Know how much alcohol is in a drink. In the [place] one drink equals one 12 oz bottle of beer (355 mL), one 5 oz  glass of wine (148 mL), or one 1 oz glass of hard liquor (44 mL). General instructions Take over-the-counter and prescription medicines only as told by your doctor. Ask your doctor if you should avoid driving or using machines while you are taking your medicine. Return to your normal activities when your doctor says that it is safe. Keep all follow-up visits. Where to find more information National Institutes of Health: www.niams.nih.gov Contact a doctor if: You have another gout attack. You still have symptoms of a gout attack after 10 days of treatment. You have problems (side effects) because of your medicines. You have chills or a fever. You have burning pain when you pee (urinate). You have pain in your lower back or belly. Get help right away if: You have very bad pain. Your pain cannot be controlled. You cannot pee. Summary Gout is painful swelling of the joints. The most common site of pain is the big toe, but it can affect other joints. Medicines and avoiding some foods can help to prevent and treat gout attacks. This information is not intended to replace advice given to you by your health care provider. Make sure you discuss any questions you have with your health care provider. Document Revised: 09/08/2021 Document Reviewed: 09/08/2021 Elsevier Patient Education  2023 [employer]  

## 2023-05-11 NOTE — Progress Notes (Signed)
Subjective:   Patient ID: Alexander Perez, male   DOB: 37 y.o.   MRN: [id number]   HPI Patient presents with a lot of pain around his big toe joint right long-term history of gout that he takes allopurinol for has never had an issue with this joint and did drop a heavy instrument on it 3 weeks ago   ROS      Objective:  Physical Exam  Neurovascular status intact with quite a bit of swelling around the first MPJ right and I also noted limitation of motion of the joint and enlargement of the joint with the inflammation.  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  I do want him to get his uric acid level checked and his C-reactive protein when he goes to his family doctor later this year and if symptoms persist I want to see him back and we may consider this more of a hallux limitus inflammatory condition versus a systemic condition  X-rays indicate significant elevation elongation segment of the first metatarsal right no indications of structural arthritis
